# Patient Record
Sex: Female | Born: 1980
Health system: Southern US, Community
[De-identification: ages and names within clinical notes are randomized; demographics above are authoritative.]

## PROBLEM LIST (undated history)

## (undated) DIAGNOSIS — I10 Essential (primary) hypertension: Secondary | ICD-10-CM

## (undated) DIAGNOSIS — E039 Hypothyroidism, unspecified: Secondary | ICD-10-CM

## (undated) DIAGNOSIS — E079 Disorder of thyroid, unspecified: Secondary | ICD-10-CM

## (undated) DIAGNOSIS — T4145XA Adverse effect of unspecified anesthetic, initial encounter: Secondary | ICD-10-CM

## (undated) DIAGNOSIS — T8859XA Other complications of anesthesia, initial encounter: Secondary | ICD-10-CM

## (undated) HISTORY — PX: DILATION AND CURETTAGE OF UTERUS: SHX78

## (undated) HISTORY — DX: Disorder of thyroid, unspecified: E07.9

## (undated) HISTORY — DX: Hypothyroidism, unspecified: E03.9

## (undated) HISTORY — DX: Other complications of anesthesia, initial encounter: T88.59XA

## (undated) HISTORY — DX: Essential (primary) hypertension: I10

---
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
## 2006-03-14 HISTORY — PX: THYROIDECTOMY: SHX17

## 2006-03-14 HISTORY — PX: WISDOM TOOTH EXTRACTION: SHX21

## 2018-11-22 ENCOUNTER — Encounter (INDEPENDENT_AMBULATORY_CARE_PROVIDER_SITE_OTHER): Payer: Self-pay

## 2018-12-13 ENCOUNTER — Telehealth: Payer: Self-pay

## 2018-12-17 ENCOUNTER — Telehealth: Payer: Self-pay | Admitting: Family Medicine

## 2018-12-17 NOTE — Telephone Encounter (Signed)
Called the patient to confirm the upcoming appointment. Left a voicemail informing the patient the appointment will be completed over the phone. The visit is not a face to face visit. If you have any questions or concerns, please give our office a call at [phone].

## 2018-12-18 ENCOUNTER — Other Ambulatory Visit: Payer: Self-pay

## 2018-12-18 ENCOUNTER — Encounter: Payer: Self-pay | Admitting: Obstetrics & Gynecology

## 2018-12-18 ENCOUNTER — Telehealth: Payer: Self-pay | Admitting: Obstetrics & Gynecology

## 2018-12-18 ENCOUNTER — Ambulatory Visit (INDEPENDENT_AMBULATORY_CARE_PROVIDER_SITE_OTHER): Payer: Self-pay | Admitting: *Deleted

## 2018-12-18 DIAGNOSIS — O0990 Supervision of high risk pregnancy, unspecified, unspecified trimester: Secondary | ICD-10-CM | POA: Insufficient documentation

## 2018-12-18 NOTE — Telephone Encounter (Signed)
Attempted to call patient to get her rescheduled for her missed ob intake appointment. No answer, left voicemail instructing patient that her appointment on 10/29 has been canceled due to her not keeping her intake appointment. Patient instructed to give the office a call back to be rescheduled. No show letter mailed.

## 2018-12-18 NOTE — Progress Notes (Signed)
[id number] I called Susan Huff for her New OB Intake telephone visit and left a message I was calling for her telephone visit and will call again in a few minutes. Also noted no records found from [facility]- registrar calling for records.  Abram Sax,RN [phone] I called Susan Huff for her New OB Intake telephone visit and left a message I was calling for her telephone visit and since I did not reach her; she will need to be rescheduled. Please call our office to reschedule. Will leave note for registrars to reschedule. Jasiah Buntin,RN

## 2019-01-10 ENCOUNTER — Encounter: Payer: Self-pay | Admitting: Obstetrics & Gynecology

## 2019-01-23 ENCOUNTER — Ambulatory Visit (INDEPENDENT_AMBULATORY_CARE_PROVIDER_SITE_OTHER): Payer: Self-pay | Admitting: *Deleted

## 2019-01-23 ENCOUNTER — Other Ambulatory Visit: Payer: Self-pay

## 2019-01-23 VITALS — Ht 62.0 in

## 2019-01-23 DIAGNOSIS — O09899 Supervision of other high risk pregnancies, unspecified trimester: Secondary | ICD-10-CM

## 2019-01-23 DIAGNOSIS — O10919 Unspecified pre-existing hypertension complicating pregnancy, unspecified trimester: Secondary | ICD-10-CM | POA: Insufficient documentation

## 2019-01-23 DIAGNOSIS — O0990 Supervision of high risk pregnancy, unspecified, unspecified trimester: Secondary | ICD-10-CM

## 2019-01-23 DIAGNOSIS — O169 Unspecified maternal hypertension, unspecified trimester: Secondary | ICD-10-CM

## 2019-01-23 DIAGNOSIS — O10019 Pre-existing essential hypertension complicating pregnancy, unspecified trimester: Secondary | ICD-10-CM

## 2019-01-23 DIAGNOSIS — Z8279 Family history of other congenital malformations, deformations and chromosomal abnormalities: Secondary | ICD-10-CM

## 2019-01-23 DIAGNOSIS — E079 Disorder of thyroid, unspecified: Secondary | ICD-10-CM | POA: Insufficient documentation

## 2019-01-23 MED ORDER — BLOOD PRESSURE KIT DEVI: 1.0000 | 0 refills | Status: AC | PRN

## 2019-01-23 NOTE — Progress Notes (Signed)
I connected with  Susan Huff on 01/23/19 at  8:30 AM EST by telephone and verified that I am speaking with the correct person using two identifiers.   I discussed the limitations, risks, security and privacy concerns of performing an evaluation and management service by telephone and the availability of in person appointments. I also discussed with the patient that there may be a patient responsible charge related to this service. The patient expressed understanding and agreed to proceed. Explained I am completing her New OB Intake today.per chart stated transfer from [facility] but clarified with patient only had pregnancy test there.  We discussed Her EDD and that it is based on  sure LMP of 10/09/18  And that was the first day of LMP . I reviewed her allergies, meds, OB History, Medical /Surgical history, and appropriate screenings.she is Toprol XL and I reviewed with Dr. Harolyn Rutherford and she may continue taking .  I explained I will send her the [employer] app- app sent to her while on phone.  I explained we will send a blood pressure cuff to [facility] that will fill that prescription and they  will call her to verify her information. I asked her to bring the blood pressure cuff with her to her first ob appointment so we can show her how to use it. Explained  then we will have her take her blood pressure weekly and enter into the app. Explained she will have some visits in office and some virtually.  I sent her the MyChart text and she was having issues with creating account - states she has tried before . I gave her the MyChart help desk number and she will contact them and then  download  app. Reviewed appointment date/ time with her , our location and to wear mask, no visitors. Explained she will have exam, ob bloodwork, hemoglobin a1C, cbg , genetic testing if desired, pap if needed. I scheduled an [place] at 19 weeks and gave her the appointment.I also offered her genetic counseling which she accepted.  She  voices understanding.   Linda,RN 01/23/2019  8:33 AM

## 2019-01-23 NOTE — Progress Notes (Signed)
Patient seen and assessed by nursing staff.  Agree with documentation and plan.  

## 2019-01-23 NOTE — Patient Instructions (Signed)

## 2019-02-06 ENCOUNTER — Encounter: Payer: Self-pay | Admitting: Family Medicine

## 2019-02-14 ENCOUNTER — Encounter (HOSPITAL_COMMUNITY): Payer: Self-pay | Admitting: Genetic Counselor

## 2019-02-15 ENCOUNTER — Ambulatory Visit (HOSPITAL_COMMUNITY): Payer: Self-pay

## 2019-02-15 ENCOUNTER — Other Ambulatory Visit (HOSPITAL_COMMUNITY): Payer: Self-pay | Admitting: *Deleted

## 2019-02-20 ENCOUNTER — Other Ambulatory Visit: Payer: Self-pay

## 2019-02-20 ENCOUNTER — Encounter: Payer: Self-pay | Admitting: Obstetrics and Gynecology

## 2019-02-20 ENCOUNTER — Ambulatory Visit (INDEPENDENT_AMBULATORY_CARE_PROVIDER_SITE_OTHER): Payer: Self-pay | Admitting: Obstetrics and Gynecology

## 2019-02-20 VITALS — BP 121/82 | HR 84 | Wt 189.0 lb

## 2019-02-20 DIAGNOSIS — Z124 Encounter for screening for malignant neoplasm of cervix: Secondary | ICD-10-CM

## 2019-02-20 DIAGNOSIS — Z1151 Encounter for screening for human papillomavirus (HPV): Secondary | ICD-10-CM

## 2019-02-20 DIAGNOSIS — O09529 Supervision of elderly multigravida, unspecified trimester: Secondary | ICD-10-CM | POA: Insufficient documentation

## 2019-02-20 DIAGNOSIS — O09212 Supervision of pregnancy with history of pre-term labor, second trimester: Secondary | ICD-10-CM

## 2019-02-20 DIAGNOSIS — O0990 Supervision of high risk pregnancy, unspecified, unspecified trimester: Secondary | ICD-10-CM

## 2019-02-20 DIAGNOSIS — O99282 Endocrine, nutritional and metabolic diseases complicating pregnancy, second trimester: Secondary | ICD-10-CM

## 2019-02-20 DIAGNOSIS — O10012 Pre-existing essential hypertension complicating pregnancy, second trimester: Secondary | ICD-10-CM

## 2019-02-20 DIAGNOSIS — O09522 Supervision of elderly multigravida, second trimester: Secondary | ICD-10-CM

## 2019-02-20 DIAGNOSIS — O09299 Supervision of pregnancy with other poor reproductive or obstetric history, unspecified trimester: Secondary | ICD-10-CM | POA: Insufficient documentation

## 2019-02-20 DIAGNOSIS — Z113 Encounter for screening for infections with a predominantly sexual mode of transmission: Secondary | ICD-10-CM

## 2019-02-20 DIAGNOSIS — O09899 Supervision of other high risk pregnancies, unspecified trimester: Secondary | ICD-10-CM

## 2019-02-20 DIAGNOSIS — O99210 Obesity complicating pregnancy, unspecified trimester: Secondary | ICD-10-CM | POA: Insufficient documentation

## 2019-02-20 DIAGNOSIS — O09292 Supervision of pregnancy with other poor reproductive or obstetric history, second trimester: Secondary | ICD-10-CM

## 2019-02-20 DIAGNOSIS — O0992 Supervision of high risk pregnancy, unspecified, second trimester: Secondary | ICD-10-CM

## 2019-02-20 DIAGNOSIS — N898 Other specified noninflammatory disorders of vagina: Secondary | ICD-10-CM

## 2019-02-20 DIAGNOSIS — E079 Disorder of thyroid, unspecified: Secondary | ICD-10-CM

## 2019-02-20 DIAGNOSIS — E669 Obesity, unspecified: Secondary | ICD-10-CM | POA: Insufficient documentation

## 2019-02-20 DIAGNOSIS — O10019 Pre-existing essential hypertension complicating pregnancy, unspecified trimester: Secondary | ICD-10-CM

## 2019-02-20 DIAGNOSIS — Z3A19 19 weeks gestation of pregnancy: Secondary | ICD-10-CM

## 2019-02-20 MED ORDER — LEVOTHYROXINE SODIUM 200 MCG PO TABS: 200.0000 ug | ORAL_TABLET | Freq: Every day | ORAL | 1 refills | Status: DC

## 2019-02-20 MED ORDER — METOPROLOL SUCCINATE ER 100 MG PO TB24: 100.0000 mg | ORAL_TABLET | Freq: Every day | ORAL | 1 refills | Status: DC

## 2019-02-20 NOTE — Progress Notes (Signed)
New OB Note  02/20/2019   Clinic: [facility]  Chief Complaint: NOB  Transfer of Care Patient: no  Primary Care: [facility] ([place] ; Dr. Erling Conte)  History of Present Illness: Susan Huff is a 38 y.o. [id number] @ 19/2 weeks (EDC 5/4 [tentative], based on bedside u/s today= Patient's last menstrual period was 10/09/2018.).  Preg complicated by has Supervision of high risk pregnancy, antepartum; Thyroid disease; Hypertension in pregnancy, pre-existing, antepartum; History of preterm delivery, currently pregnant; AMA (advanced maternal age) multigravida 35+; and History of pre-eclampsia in prior pregnancy, currently pregnant on their problem list.   Any events prior to today's visit: no Her periods were: pretty regular, qmonth She was using no method when she conceived.  She has Negative signs or symptoms of nausea/vomiting of pregnancy. She has Negative signs or symptoms of miscarriage or preterm labor On any medications around the time she conceived/early pregnancy: No   ROS: A 12-point review of systems was performed and negative, except as stated in the above HPI.  OBGYN History: As per HPI. OB History  Gravida Para Term Preterm AB Living  6 4 2 2 1 4   SAB TAB Ectopic Multiple Live Births          4    # Outcome Date GA Lbr Len/2nd Weight Sex Delivery Anes PTL Lv  6 Current           5 Term 12/29/14 [redacted]w[redacted]d  6 lb 8 oz (2.948 kg)  Vag-Spont EPI  LIV     Birth Comments: On toprol , and on synthroid;   4 Term 11/05/06 [redacted]w[redacted]d  6 lb 14 oz (3.118 kg)  Vag-Spont EPI  LIV     Birth Comments: Thyroid issues, at 6 months had thyroid storm, and had thyroidectomy, was on Toprol XL  3 Preterm 12/19/05 [redacted]w[redacted]d  3 lb 14 oz (1.758 kg) F    LIV     Birth Comments: hydrocephalus, PROM, PTD , had thyroid issues- levels were " out of wack" meds made her sick ; was on Toprol XL  2 Preterm 01/28/05 [redacted]w[redacted]d  2 lb 8 oz (1.134 kg)  Vag-Spont EPI  LIV     Birth  Comments: IOL for preeeclampsia, high blood pressure diagnosed during pregnancy- started toprol   1 AB 2005 [redacted]w[redacted]d            Birth Comments: had D&C  , no FHR at prenatal appt    Any issues with any prior pregnancies: Yes, see above. Patient states after thyroidectomy with G4 pregnancy her subsequent pregnancies have been fine and w/o issue Prior children are healthy, doing well, and without any problems or issues: yes History of pap smears: Yes. Last pap smear with PCP a few years ago, negative  Past Medical History: Past Medical History:  Diagnosis Date  . Complication of anesthesia    in dental office; had to be resuscitate and sent to  hospital  . Hypertension   . Preterm labor   . Thyroid disease     Past Surgical History: Past Surgical History:  Procedure Laterality Date  . DILATION AND CURETTAGE OF UTERUS    . THYROIDECTOMY  2008  . WISDOM TOOTH EXTRACTION  2008    Family History:  Family History  Adopted: Yes     Social History:  Social History   Socioeconomic History  . Marital status: Single    Spouse name: Not on file  . Number of children: Not on file  .  Years of education: Not on file  . Highest education level: Not on file  Occupational History  . Not on file  Social Needs  . Financial resource strain: Not on file  . Food insecurity    Worry: Never true    Inability: Never true  . Transportation needs    Medical: No    Non-medical: No  Tobacco Use  . Smoking status: Former Smoker    Types: Cigarettes    Quit date: 01/22/2017    Years since quitting: 2.0  . Smokeless tobacco: Never Used  Substance and Sexual Activity  . Alcohol use: Never    Frequency: Never  . Drug use: Never  . Sexual activity: Yes    Birth control/protection: None  Lifestyle  . Physical activity    Days per week: Not on file    Minutes per session: Not on file  . Stress: Not on file  Relationships  . Social Herbalist on phone: Not on file    Gets  together: Not on file    Attends religious service: Not on file    Active member of club or organization: Not on file    Attends meetings of clubs or organizations: Not on file    Relationship status: Not on file  . Intimate partner violence    Fear of current or ex partner: Not on file    Emotionally abused: Not on file    Physically abused: Not on file    Forced sexual activity: Not on file  Other Topics Concern  . Not on file  Social History Narrative  . Not on file    Allergy: Allergies  Allergen Reactions  . Sulfa Antibiotics Rash   Current Outpatient Medications: PNV Synthroid 200 qday (same as pre-pregnancy dose) Toprol XL 100 qday  Physical Exam:   BP 121/82   Pulse 84   Wt 189 lb (85.7 kg)   LMP 10/09/2018   BMI 34.57 kg/m  Body mass index is 34.57 kg/m. Contractions: Not present Vag. Bleeding: None. Fundal height: not applicable FHTs: 761P  General appearance: Well nourished, well developed female in no acute distress.  Neck:  Supple, normal appearance, and no thyromegaly  Cardiovascular: S1, S2 normal, no murmur, rub or gallop, regular rate and rhythm Respiratory:  Clear to auscultation bilateral. Normal respiratory effort Abdomen: positive bowel sounds and no masses, hernias; diffusely non tender to palpation, non distended Breasts: breasts appear normal, no suspicious masses, no skin or nipple changes or axillary nodes, and normal palpation. Neuro/Psych:  Normal mood and affect.  Skin:  Warm and dry.  Lymphatic:  No inguinal lymphadenopathy.   Pelvic exam: is not limited by body habitus EGBUS: within normal limits, Vagina: within normal limits and with no blood in the vault, Cervix: normal appearing cervix without discharge or lesions, closed/long/high, Uterus:  enlarged, c/w 18 week size, and Adnexa:  normal adnexa and no mass, fullness, tenderness  Laboratory: none  Imaging:  Bedside u/s: SLIUP, subj normal AF, +FM, FHR 160s, BPD/HC/FL c/w  18/0  Assessment: pt doing well  Plan: 1. Supervision of high risk pregnancy, antepartum Routine care. Amenable to starting low dose asa - Obstetric Panel, Including HIV - Hemoglobin A1c - Culture, OB Urine - Genetic Screening - AFP, Serum, Open Spina Bifida - CMP - TSH - T4, free - Protein / creatinine ratio, urine - Cytology - PAP( [place])  2. Multigravida of advanced maternal age in second trimester Genetics today F/u anatomy u/s  3. History of pre-eclampsia in prior pregnancy, currently pregnant  4. Thyroid disease Followed by PCP prior to this. Follow up labs  5. Pre-existing essential hypertension during pregnancy, antepartum Serial growth u/s, routine ap testing  6. History of preterm delivery, currently pregnant D/w her that it sounds like it was related to thyroid issue since no issues after her thyroid surgery and has never been on 17p. D/w her that she is technically able to get 17p. Pt declines which I think is very reasonable.   Problem list reviewed and updated.  Follow up in 3 weeks. Same day as anatomy u/s  The nature of [facility] with multiple MDs and other Advanced Practice Providers was explained to patient; also emphasized that residents, students are part of our team.  >50% of 25 min visit spent on counseling and coordination of care.     Cornelia Copaharlie Samiyyah Moffa, Jr. MD Attending [facility](Faculty Practice)

## 2019-02-20 NOTE — Progress Notes (Signed)
Need refill on synthroid & Toprol-xl

## 2019-02-21 LAB — COMPREHENSIVE METABOLIC PANEL
ALT: 24 IU/L (ref 0–32)
AST: 24 IU/L (ref 0–40)
Albumin/Globulin Ratio: 1.4 (ref 1.2–2.2)
Albumin: 4.1 g/dL (ref 3.8–4.8)
Alkaline Phosphatase: 62 IU/L (ref 39–117)
BUN/Creatinine Ratio: 7 — ABNORMAL LOW (ref 9–23)
BUN: 4 mg/dL — ABNORMAL LOW (ref 6–20)
Bilirubin Total: 0.4 mg/dL (ref 0.0–1.2)
CO2: 18 mmol/L — ABNORMAL LOW (ref 20–29)
Calcium: 9.3 mg/dL (ref 8.7–10.2)
Chloride: 101 mmol/L (ref 96–106)
Creatinine, Ser: 0.6 mg/dL (ref 0.57–1.00)
GFR calc Af Amer: 135 mL/min/{1.73_m2} (ref >59)
GFR calc non Af Amer: 117 mL/min/{1.73_m2} (ref >59)
Globulin, Total: 2.9 g/dL (ref 1.5–4.5)
Glucose: 69 mg/dL (ref 65–99)
Potassium: 3.7 mmol/L (ref 3.5–5.2)
Sodium: 136 mmol/L (ref 134–144)
Total Protein: 7 g/dL (ref 6.0–8.5)

## 2019-02-21 LAB — PROTEIN / CREATININE RATIO, URINE
Creatinine, Urine: 344.8 mg/dL
Protein, Ur: 80.6 mg/dL
Protein/Creat Ratio: 234 mg/g creat — ABNORMAL HIGH (ref 0–200)

## 2019-02-21 LAB — TSH: TSH: 74.7 u[IU]/mL — ABNORMAL HIGH (ref 0.450–4.500)

## 2019-02-21 LAB — T4, FREE: Free T4: 0.25 ng/dL — ABNORMAL LOW (ref 0.82–1.77)

## 2019-02-22 LAB — AFP, SERUM, OPEN SPINA BIFIDA
AFP MoM: 1.17
AFP Value: 56 ng/mL
Gest. Age on Collection Date: 19.1 weeks
Maternal Age At EDD: 37.9 yr
OSBR Risk 1 IN: 10000
Test Results:: NEGATIVE
Weight: 189 [lb_av]

## 2019-02-22 LAB — OBSTETRIC PANEL, INCLUDING HIV
Antibody Screen: NEGATIVE
Basophils Absolute: 0 10*3/uL (ref 0.0–0.2)
Basos: 0 %
EOS (ABSOLUTE): 0.1 10*3/uL (ref 0.0–0.4)
Eos: 1 %
HIV Screen 4th Generation wRfx: NONREACTIVE
Hematocrit: 39.1 % (ref 34.0–46.6)
Hemoglobin: 12.8 g/dL (ref 11.1–15.9)
Hepatitis B Surface Ag: NEGATIVE
Immature Grans (Abs): 0 10*3/uL (ref 0.0–0.1)
Immature Granulocytes: 0 %
Lymphocytes Absolute: 1.8 10*3/uL (ref 0.7–3.1)
Lymphs: 26 %
MCH: 29.6 pg (ref 26.6–33.0)
MCHC: 32.7 g/dL (ref 31.5–35.7)
MCV: 91 fL (ref 79–97)
Monocytes Absolute: 0.4 10*3/uL (ref 0.1–0.9)
Monocytes: 5 %
Neutrophils Absolute: 4.6 10*3/uL (ref 1.4–7.0)
Neutrophils: 68 %
Platelets: 368 10*3/uL (ref 150–450)
RBC: 4.32 x10E6/uL (ref 3.77–5.28)
RDW: 14.9 % (ref 11.7–15.4)
RPR Ser Ql: NONREACTIVE
Rh Factor: POSITIVE
Rubella Antibodies, IGG: 5.78 index (ref >0.99)
WBC: 6.9 10*3/uL (ref 3.4–10.8)

## 2019-02-22 LAB — HEMOGLOBIN A1C
Est. average glucose Bld gHb Est-mCnc: 105 mg/dL
Hgb A1c MFr Bld: 5.3 % (ref 4.8–5.6)

## 2019-02-22 LAB — URINE CULTURE, OB REFLEX

## 2019-02-22 LAB — CULTURE, OB URINE

## 2019-02-25 ENCOUNTER — Other Ambulatory Visit: Payer: Self-pay | Admitting: Obstetrics and Gynecology

## 2019-02-25 DIAGNOSIS — E079 Disorder of thyroid, unspecified: Secondary | ICD-10-CM

## 2019-02-25 DIAGNOSIS — O0990 Supervision of high risk pregnancy, unspecified, unspecified trimester: Secondary | ICD-10-CM

## 2019-02-25 MED ORDER — LEVOTHYROXINE SODIUM 50 MCG PO TABS: 50.0000 ug | ORAL_TABLET | Freq: Every day | ORAL | 0 refills | Status: DC

## 2019-02-25 MED ORDER — LEVOTHYROXINE SODIUM 200 MCG PO TABS: 200.0000 ug | ORAL_TABLET | Freq: Every day | ORAL | 0 refills | Status: DC

## 2019-02-28 ENCOUNTER — Encounter: Payer: Self-pay | Admitting: General Practice

## 2019-03-04 LAB — CYTOLOGY - PAP
Chlamydia: NEGATIVE
Comment: NEGATIVE
Comment: NEGATIVE
Comment: NEGATIVE
Comment: NEGATIVE
Comment: NEGATIVE
Comment: NORMAL
HPV 16: NEGATIVE
HPV 18 / 45: NEGATIVE
High risk HPV: POSITIVE — AB
Trichomonas: NEGATIVE

## 2019-03-05 ENCOUNTER — Encounter: Payer: Self-pay | Admitting: Obstetrics and Gynecology

## 2019-03-05 DIAGNOSIS — R87612 Low grade squamous intraepithelial lesion on cytologic smear of cervix (LGSIL): Secondary | ICD-10-CM | POA: Insufficient documentation

## 2019-03-13 ENCOUNTER — Other Ambulatory Visit (HOSPITAL_COMMUNITY): Payer: Self-pay | Admitting: *Deleted

## 2019-03-13 ENCOUNTER — Encounter: Payer: Self-pay | Admitting: Obstetrics and Gynecology

## 2019-03-13 ENCOUNTER — Ambulatory Visit (HOSPITAL_COMMUNITY): Payer: Self-pay | Admitting: *Deleted

## 2019-03-13 ENCOUNTER — Encounter (HOSPITAL_COMMUNITY): Payer: Self-pay

## 2019-03-13 ENCOUNTER — Other Ambulatory Visit: Payer: Self-pay

## 2019-03-13 ENCOUNTER — Ambulatory Visit (HOSPITAL_COMMUNITY)
Admission: RE | Admit: 2019-03-13 | Discharge: 2019-03-13 | Disposition: A | Discharge status: Home / Self Care | Payer: Self-pay | Source: Ambulatory Visit | Attending: Family Medicine | Admitting: Family Medicine

## 2019-03-13 DIAGNOSIS — Z8279 Family history of other congenital malformations, deformations and chromosomal abnormalities: Secondary | ICD-10-CM | POA: Insufficient documentation

## 2019-03-13 DIAGNOSIS — O0990 Supervision of high risk pregnancy, unspecified, unspecified trimester: Secondary | ICD-10-CM | POA: Insufficient documentation

## 2019-03-13 DIAGNOSIS — O09292 Supervision of pregnancy with other poor reproductive or obstetric history, second trimester: Secondary | ICD-10-CM

## 2019-03-13 DIAGNOSIS — O10019 Pre-existing essential hypertension complicating pregnancy, unspecified trimester: Secondary | ICD-10-CM | POA: Insufficient documentation

## 2019-03-13 DIAGNOSIS — O10912 Unspecified pre-existing hypertension complicating pregnancy, second trimester: Secondary | ICD-10-CM

## 2019-03-13 DIAGNOSIS — O09899 Supervision of other high risk pregnancies, unspecified trimester: Secondary | ICD-10-CM | POA: Insufficient documentation

## 2019-03-13 DIAGNOSIS — E079 Disorder of thyroid, unspecified: Secondary | ICD-10-CM | POA: Insufficient documentation

## 2019-03-13 DIAGNOSIS — O09522 Supervision of elderly multigravida, second trimester: Secondary | ICD-10-CM

## 2019-03-13 DIAGNOSIS — O99282 Endocrine, nutritional and metabolic diseases complicating pregnancy, second trimester: Secondary | ICD-10-CM

## 2019-03-13 DIAGNOSIS — O10012 Pre-existing essential hypertension complicating pregnancy, second trimester: Secondary | ICD-10-CM

## 2019-03-13 DIAGNOSIS — Z3A20 20 weeks gestation of pregnancy: Secondary | ICD-10-CM

## 2019-03-13 DIAGNOSIS — O09212 Supervision of pregnancy with history of pre-term labor, second trimester: Secondary | ICD-10-CM

## 2019-03-13 NOTE — Progress Notes (Signed)
Patient did not keep her OB appointment for 03/13/2019.  Susan Huff, Jr MD Attending [facility] ([facility] Practice)   

## 2019-03-18 ENCOUNTER — Ambulatory Visit (HOSPITAL_COMMUNITY): Payer: Self-pay

## 2019-03-18 ENCOUNTER — Encounter (HOSPITAL_COMMUNITY): Payer: Self-pay

## 2019-03-18 ENCOUNTER — Other Ambulatory Visit (HOSPITAL_COMMUNITY): Payer: Self-pay

## 2019-03-25 ENCOUNTER — Ambulatory Visit (INDEPENDENT_AMBULATORY_CARE_PROVIDER_SITE_OTHER): Payer: Self-pay | Admitting: Student

## 2019-03-25 VITALS — BP 121/83 | HR 77 | Wt 198.0 lb

## 2019-03-25 DIAGNOSIS — O0990 Supervision of high risk pregnancy, unspecified, unspecified trimester: Secondary | ICD-10-CM

## 2019-03-25 DIAGNOSIS — Z3A21 21 weeks gestation of pregnancy: Secondary | ICD-10-CM

## 2019-03-25 DIAGNOSIS — E079 Disorder of thyroid, unspecified: Secondary | ICD-10-CM

## 2019-03-25 NOTE — Progress Notes (Signed)
   PRENATAL VISIT NOTE  Subjective:  Susan Huff is a 39 y.o. [phone] at [redacted]w[redacted]d being seen today for ongoing prenatal care.  She is currently monitored for the following issues for this high-risk pregnancy and has Supervision of high risk pregnancy, antepartum; Thyroid disease; Hypertension in pregnancy, pre-existing, antepartum; History of preterm delivery, currently pregnant; AMA (advanced maternal age) multigravida 35+; History of pre-eclampsia in prior pregnancy, currently pregnant; Obesity in pregnancy; Obesity, Class II, BMI 35-39.9; and LGSIL on Pap smear of cervix on their problem list.  Patient reports no complaints.  Contractions: Not present. Vag. Bleeding: None.  Movement: Present. Denies leaking of fluid.   Patient reports episode of low blood pressure on Friday. States she felt foggy and weak all day. Symptoms have resolved and BPs back to normal.   The following portions of the patient's history were reviewed and updated as appropriate: allergies, current medications, past family history, past medical history, past social history, past surgical history and problem list.   Objective:   Vitals:   03/25/19 0939  BP: 121/83  Pulse: 77  Weight: 198 lb (89.8 kg)    Fetal Status: Fetal Heart Rate (bpm): 152   Movement: Present     General:  Alert, oriented and cooperative. Patient is in no acute distress.  Skin: Skin is warm and dry. No rash noted.   Cardiovascular: Normal heart rate noted  Respiratory: Normal respiratory effort, no problems with respiration noted  Abdomen: Soft, gravid, appropriate for gestational age.  Pain/Pressure: Absent     Pelvic: Cervical exam deferred        Extremities: Normal range of motion.  Edema: None  Mental Status: Normal mood and affect. Normal behavior. Normal judgment and thought content.   Assessment and Plan:  Pregnancy: [id number] at [redacted]w[redacted]d 1. Thyroid disease -synthroid increased after last visit & pt reports taking as prescribed. Labs  checked today.  - TSH - T4, free  2. Supervision of high risk pregnancy, antepartum -EDD updated per MFM  -BP normal today and back to baseline per patient. Will not make antihypertensive adjustment at this time but patient will reach out to [place] if she continues to have hypotensive episodes.   Preterm labor symptoms and general obstetric precautions including but not limited to vaginal bleeding, contractions, leaking of fluid and fetal movement were reviewed in detail with the patient. Please refer to After Visit Summary for other counseling recommendations.   Return in about 4 weeks (around 04/22/2019) for high risk ob with MD.  Future Appointments  Date Time Provider Department Center  04/10/2019  7:45 AM WH-MFC [place] 2 WH-MFCUS MFC-US  04/10/2019  7:55 AM WH-MFC NURSE WH-MFC MFC-US  04/22/2019  8:15 AM Reva Bores, MD [facility]    Judeth Horn, NP

## 2019-03-25 NOTE — Progress Notes (Signed)
Experiencing Low blood pressure 88/56 and was feeling bad on friday morning. Has flu and Tdap @ work @ [employer]

## 2019-03-25 NOTE — Patient Instructions (Addendum)
The Maternity Assessment Unit (MAU) is located at the [facility] and [place] at [facility]. The address is: [address] [place], [place], [place] [address]. Please see map below for additional directions.    The Maternity Assessment Unit is designed to help you during your pregnancy, and for up to 6 weeks after delivery, with any pregnancy- or postpartum-related emergencies, if you think you are in labor, or if your water has broken. For example, if you experience nausea and vomiting, vaginal bleeding, severe abdominal or pelvic pain, elevated blood pressure or other problems related to your pregnancy or postpartum time, please come to the Maternity Assessment Unit for assistance.      Second Trimester of Pregnancy The second trimester is from week 14 through week 27 (months 4 through 6). The second trimester is often a time when you feel your best. Your body has adjusted to being pregnant, and you begin to feel better physically. Usually, morning sickness has lessened or quit completely, you may have more energy, and you may have an increase in appetite. The second trimester is also a time when the fetus is growing rapidly. At the end of the sixth month, the fetus is about 9 inches long and weighs about 1 pounds. You will likely begin to feel the baby move (quickening) between 16 and 20 weeks of pregnancy. Body changes during your second trimester Your body continues to go through many changes during your second trimester. The changes vary from woman to woman.  Your weight will continue to increase. You will notice your lower abdomen bulging out.  You may begin to get stretch marks on your hips, abdomen, and breasts.  You may develop headaches that can be relieved by medicines. The medicines should be approved by your health care provider.  You may urinate more often because the fetus is pressing on your bladder.  You may develop or continue to have heartburn as  a result of your pregnancy.  You may develop constipation because certain hormones are causing the muscles that push waste through your intestines to slow down.  You may develop hemorrhoids or swollen, bulging veins (varicose veins).  You may have back pain. This is caused by: ? Weight gain. ? Pregnancy hormones that are relaxing the joints in your pelvis. ? A shift in weight and the muscles that support your balance.  Your breasts will continue to grow and they will continue to become tender.  Your gums may bleed and may be sensitive to brushing and flossing.  Dark spots or blotches (chloasma, mask of pregnancy) may develop on your face. This will likely fade after the baby is born.  A dark line from your belly button to the pubic area (linea nigra) may appear. This will likely fade after the baby is born.  You may have changes in your hair. These can include thickening of your hair, rapid growth, and changes in texture. Some women also have hair loss during or after pregnancy, or hair that feels dry or thin. Your hair will most likely return to normal after your baby is born. What to expect at prenatal visits During a routine prenatal visit:  You will be weighed to make sure you and the fetus are growing normally.  Your blood pressure will be taken.  Your abdomen will be measured to track your baby's growth.  The fetal heartbeat will be listened to.  Any test results from the previous visit will be discussed. Your health care provider  may ask you:  How you are feeling.  If you are feeling the baby move.  If you have had any abnormal symptoms, such as leaking fluid, bleeding, severe headaches, or abdominal cramping.  If you are using any tobacco products, including cigarettes, chewing tobacco, and electronic cigarettes.  If you have any questions. Other tests that may be performed during your second trimester include:  Blood tests that check for: ? Low iron levels  (anemia). ? High blood sugar that affects pregnant women (gestational diabetes) between 24 and 28 weeks. ? Rh antibodies. This is to check for a protein on red blood cells (Rh factor).  Urine tests to check for infections, diabetes, or protein in the urine.  An ultrasound to confirm the proper growth and development of the baby.  An amniocentesis to check for possible genetic problems.  Fetal screens for spina bifida and Down syndrome.  HIV (human immunodeficiency virus) testing. Routine prenatal testing includes screening for HIV, unless you choose not to have this test. Follow these instructions at home: Medicines  Follow your health care provider's instructions regarding medicine use. Specific medicines may be either safe or unsafe to take during pregnancy.  Take a prenatal vitamin that contains at least 600 micrograms (mcg) of folic acid.  If you develop constipation, try taking a stool softener if your health care provider approves. Eating and drinking   Eat a balanced diet that includes fresh fruits and vegetables, whole grains, good sources of protein such as meat, eggs, or tofu, and low-fat dairy. Your health care provider will help you determine the amount of weight gain that is right for you.  Avoid raw meat and uncooked cheese. These carry germs that can cause birth defects in the baby.  If you have low calcium intake from food, talk to your health care provider about whether you should take a daily calcium supplement.  Limit foods that are high in fat and processed sugars, such as fried and sweet foods.  To prevent constipation: ? Drink enough fluid to keep your urine clear or pale yellow. ? Eat foods that are high in fiber, such as fresh fruits and vegetables, whole grains, and beans. Activity  Exercise only as directed by your health care provider. Most women can continue their usual exercise routine during pregnancy. Try to exercise for 30 minutes at least 5 days a  week. Stop exercising if you experience uterine contractions.  Avoid heavy lifting, wear low heel shoes, and practice good posture.  A sexual relationship may be continued unless your health care provider directs you otherwise. Relieving pain and discomfort  Wear a good support bra to prevent discomfort from breast tenderness.  Take warm sitz baths to soothe any pain or discomfort caused by hemorrhoids. Use hemorrhoid cream if your health care provider approves.  Rest with your legs elevated if you have leg cramps or low back pain.  If you develop varicose veins, wear support hose. Elevate your feet for 15 minutes, 3-4 times a day. Limit salt in your diet. Prenatal Care  Write down your questions. Take them to your prenatal visits.  Keep all your prenatal visits as told by your health care provider. This is important. Safety  Wear your seat belt at all times when driving.  Make a list of emergency phone numbers, including numbers for family, friends, the hospital, and police and fire departments. General instructions  Ask your health care provider for a referral to a local prenatal education class. Begin classes no  later than the beginning of month 6 of your pregnancy.  Ask for help if you have counseling or nutritional needs during pregnancy. Your health care provider can offer advice or refer you to specialists for help with various needs.  Do not use hot tubs, steam rooms, or saunas.  Do not douche or use tampons or scented sanitary pads.  Do not cross your legs for long periods of time.  Avoid cat litter boxes and soil used by cats. These carry germs that can cause birth defects in the baby and possibly loss of the fetus by miscarriage or stillbirth.  Avoid all smoking, herbs, alcohol, and unprescribed drugs. Chemicals in these products can affect the formation and growth of the baby.  Do not use any products that contain nicotine or tobacco, such as cigarettes and  e-cigarettes. If you need help quitting, ask your health care provider.  Visit your dentist if you have not gone yet during your pregnancy. Use a soft toothbrush to brush your teeth and be gentle when you floss. Contact a health care provider if:  You have dizziness.  You have mild pelvic cramps, pelvic pressure, or nagging pain in the abdominal area.  You have persistent nausea, vomiting, or diarrhea.  You have a bad smelling vaginal discharge.  You have pain when you urinate. Get help right away if:  You have a fever.  You are leaking fluid from your vagina.  You have spotting or bleeding from your vagina.  You have severe abdominal cramping or pain.  You have rapid weight gain or weight loss.  You have shortness of breath with chest pain.  You notice sudden or extreme swelling of your face, hands, ankles, feet, or legs.  You have not felt your baby move in over an hour.  You have severe headaches that do not go away when you take medicine.  You have vision changes. Summary  The second trimester is from week 14 through week 27 (months 4 through 6). It is also a time when the fetus is growing rapidly.  Your body goes through many changes during pregnancy. The changes vary from woman to woman.  Avoid all smoking, herbs, alcohol, and unprescribed drugs. These chemicals affect the formation and growth your baby.  Do not use any tobacco products, such as cigarettes, chewing tobacco, and e-cigarettes. If you need help quitting, ask your health care provider.  Contact your health care provider if you have any questions. Keep all prenatal visits as told by your health care provider. This is important. This information is not intended to replace advice given to you by your health care provider. Make sure you discuss any questions you have with your health care provider. Document Revised: 06/22/2018 Document Reviewed: 04/05/2016 Elsevier Patient Education  2020 [employer].

## 2019-03-26 LAB — TSH: TSH: 35.9 u[IU]/mL — ABNORMAL HIGH (ref 0.450–4.500)

## 2019-03-26 LAB — T4, FREE: Free T4: 0.54 ng/dL — ABNORMAL LOW (ref 0.82–1.77)

## 2019-03-26 MED ORDER — LEVOTHYROXINE SODIUM 50 MCG PO TABS: 100.0000 ug | ORAL_TABLET | Freq: Every day | ORAL | 0 refills | Status: DC

## 2019-03-26 NOTE — Addendum Note (Signed)
Addended by: Judeth Horn B on: 03/26/2019 03:26 PM   Modules accepted: Orders

## 2019-03-27 ENCOUNTER — Encounter: Payer: Self-pay | Admitting: *Deleted

## 2019-04-10 ENCOUNTER — Encounter (HOSPITAL_COMMUNITY): Payer: Self-pay

## 2019-04-10 ENCOUNTER — Ambulatory Visit (HOSPITAL_COMMUNITY): Payer: Self-pay

## 2019-04-10 ENCOUNTER — Ambulatory Visit (HOSPITAL_COMMUNITY): Payer: Self-pay | Attending: Obstetrics and Gynecology

## 2019-04-16 ENCOUNTER — Other Ambulatory Visit: Payer: Self-pay

## 2019-04-16 MED ORDER — LEVOTHYROXINE SODIUM 300 MCG PO TABS: 300.0000 ug | ORAL_TABLET | Freq: Every day | ORAL | 1 refills | Status: DC

## 2019-04-16 NOTE — Progress Notes (Signed)
Per Judeth Horn last note increase levothyroxine to 300mg  daily Pharmacy sent over request to new Rx Filled per note and per protocol.

## 2019-04-17 ENCOUNTER — Encounter: Payer: Self-pay | Admitting: Obstetrics and Gynecology

## 2019-04-17 DIAGNOSIS — D563 Thalassemia minor: Secondary | ICD-10-CM | POA: Insufficient documentation

## 2019-04-22 ENCOUNTER — Other Ambulatory Visit: Payer: Self-pay

## 2019-04-22 ENCOUNTER — Telehealth (INDEPENDENT_AMBULATORY_CARE_PROVIDER_SITE_OTHER): Payer: Self-pay | Admitting: Obstetrics & Gynecology

## 2019-04-22 VITALS — BP 126/79 | HR 87

## 2019-04-22 DIAGNOSIS — O0990 Supervision of high risk pregnancy, unspecified, unspecified trimester: Secondary | ICD-10-CM

## 2019-04-22 DIAGNOSIS — O09899 Supervision of other high risk pregnancies, unspecified trimester: Secondary | ICD-10-CM

## 2019-04-22 DIAGNOSIS — O10019 Pre-existing essential hypertension complicating pregnancy, unspecified trimester: Secondary | ICD-10-CM

## 2019-04-22 DIAGNOSIS — E079 Disorder of thyroid, unspecified: Secondary | ICD-10-CM

## 2019-04-22 DIAGNOSIS — O10012 Pre-existing essential hypertension complicating pregnancy, second trimester: Secondary | ICD-10-CM

## 2019-04-22 DIAGNOSIS — O09522 Supervision of elderly multigravida, second trimester: Secondary | ICD-10-CM

## 2019-04-22 DIAGNOSIS — Z3A25 25 weeks gestation of pregnancy: Secondary | ICD-10-CM

## 2019-04-22 NOTE — Progress Notes (Signed)
I connected with  Susan Huff on 04/22/19 at  8:15 AM EST by telephone and verified that I am speaking with the correct person using two identifiers.   I discussed the limitations, risks, security and privacy concerns of performing an evaluation and management service by telephone and the availability of in person appointments. I also discussed with the patient that there may be a patient responsible charge related to this service. The patient expressed understanding and agreed to proceed.  [name], CMA 04/22/2019  8:25 AM

## 2019-04-22 NOTE — Progress Notes (Signed)
    TELEHEALTH VIRTUAL OBSTETRICS VISIT ENCOUNTER NOTE  I connected with Susan Huff on 04/22/19 at  8:15 AM EST by telephone at home and verified that I am speaking with the correct person using two identifiers.   I discussed the limitations, risks, security and privacy concerns of performing an evaluation and management service by telephone and the availability of in person appointments. I also discussed with the patient that there may be a patient responsible charge related to this service. The patient expressed understanding and agreed to proceed.  Subjective:  Susan Huff is a 39 y.o. [phone] at [redacted]w[redacted]d being followed for ongoing prenatal care.  She is currently monitored for the following issues for this high-risk pregnancy and has Supervision of high risk pregnancy, antepartum; Thyroid disease; Hypertension in pregnancy, pre-existing, antepartum; History of preterm delivery, currently pregnant; AMA (advanced maternal age) multigravida 35+; History of pre-eclampsia in prior pregnancy, currently pregnant; Obesity in pregnancy; Obesity, Class II, BMI 35-39.9; LGSIL on Pap smear of cervix; and Alpha thalassemia silent carrier on their problem list.  Patient reports no complaints. Reports fetal movement. Denies any contractions, bleeding or leaking of fluid.   The following portions of the patient's history were reviewed and updated as appropriate: allergies, current medications, past family history, past medical history, past social history, past surgical history and problem list.   Objective:   General:  Alert, oriented and cooperative.   Mental Status: Normal mood and affect perceived. Normal judgment and thought content.  Rest of physical exam deferred due to type of encounter  Assessment and Plan:  Pregnancy: [id number] at [redacted]w[redacted]d 1. Supervision of high risk pregnancy, antepartum growth - [place] MFM OB FOLLOW UP; Future  2. History of preterm delivery, currently pregnant  3. Thyroid  disease TSH in 2 weeks  4. Pre-existing essential hypertension during pregnancy, antepartum BP control is acceptible  Preterm labor symptoms and general obstetric precautions including but not limited to vaginal bleeding, contractions, leaking of fluid and fetal movement were reviewed in detail with the patient.  I discussed the assessment and treatment plan with the patient. The patient was provided an opportunity to ask questions and all were answered. The patient agreed with the plan and demonstrated an understanding of the instructions. The patient was advised to call back or seek an in-person office evaluation/go to MAU at [facility] for any urgent or concerning symptoms. Please refer to After Visit Summary for other counseling recommendations.   I provided 12 minutes of non-face-to-face time during this encounter.  Return in about 2 weeks (around 05/06/2019) for TSH and 2 hr.  No future appointments.  Scheryl Darter, MD [facility], [facility]

## 2019-04-22 NOTE — Patient Instructions (Signed)

## 2019-05-08 ENCOUNTER — Other Ambulatory Visit: Payer: Self-pay | Admitting: *Deleted

## 2019-05-08 DIAGNOSIS — O10019 Pre-existing essential hypertension complicating pregnancy, unspecified trimester: Secondary | ICD-10-CM

## 2019-05-08 DIAGNOSIS — O09529 Supervision of elderly multigravida, unspecified trimester: Secondary | ICD-10-CM

## 2019-05-08 DIAGNOSIS — O0990 Supervision of high risk pregnancy, unspecified, unspecified trimester: Secondary | ICD-10-CM

## 2019-05-08 DIAGNOSIS — O09899 Supervision of other high risk pregnancies, unspecified trimester: Secondary | ICD-10-CM

## 2019-05-10 ENCOUNTER — Other Ambulatory Visit: Payer: Self-pay

## 2019-05-10 ENCOUNTER — Ambulatory Visit (INDEPENDENT_AMBULATORY_CARE_PROVIDER_SITE_OTHER): Payer: Managed Care, Other (non HMO) | Admitting: Obstetrics & Gynecology

## 2019-05-10 ENCOUNTER — Encounter: Payer: Self-pay | Admitting: *Deleted

## 2019-05-10 ENCOUNTER — Other Ambulatory Visit: Payer: Managed Care, Other (non HMO)

## 2019-05-10 VITALS — BP 118/72 | HR 80 | Wt 189.0 lb

## 2019-05-10 DIAGNOSIS — O10019 Pre-existing essential hypertension complicating pregnancy, unspecified trimester: Secondary | ICD-10-CM

## 2019-05-10 DIAGNOSIS — O09523 Supervision of elderly multigravida, third trimester: Secondary | ICD-10-CM

## 2019-05-10 DIAGNOSIS — D563 Thalassemia minor: Secondary | ICD-10-CM

## 2019-05-10 DIAGNOSIS — O10013 Pre-existing essential hypertension complicating pregnancy, third trimester: Secondary | ICD-10-CM

## 2019-05-10 DIAGNOSIS — O0993 Supervision of high risk pregnancy, unspecified, third trimester: Secondary | ICD-10-CM

## 2019-05-10 DIAGNOSIS — O09899 Supervision of other high risk pregnancies, unspecified trimester: Secondary | ICD-10-CM

## 2019-05-10 DIAGNOSIS — O09293 Supervision of pregnancy with other poor reproductive or obstetric history, third trimester: Secondary | ICD-10-CM

## 2019-05-10 DIAGNOSIS — O09529 Supervision of elderly multigravida, unspecified trimester: Secondary | ICD-10-CM

## 2019-05-10 DIAGNOSIS — O09299 Supervision of pregnancy with other poor reproductive or obstetric history, unspecified trimester: Secondary | ICD-10-CM

## 2019-05-10 DIAGNOSIS — E079 Disorder of thyroid, unspecified: Secondary | ICD-10-CM

## 2019-05-10 DIAGNOSIS — Z3A28 28 weeks gestation of pregnancy: Secondary | ICD-10-CM

## 2019-05-10 DIAGNOSIS — O0990 Supervision of high risk pregnancy, unspecified, unspecified trimester: Secondary | ICD-10-CM

## 2019-05-10 NOTE — Progress Notes (Signed)
Pt reports spotting on 05/07/19 that resolved completely. Complaining of increased fatigue and pelvic pressure.  Fleet Contras RN 05/10/19

## 2019-05-10 NOTE — Patient Instructions (Signed)

## 2019-05-10 NOTE — Progress Notes (Signed)
   PRENATAL VISIT NOTE  Subjective:  Susan Huff is a 39 y.o. [phone] at [redacted]w[redacted]d being seen today for ongoing prenatal care.  She is currently monitored for the following issues for this high-risk pregnancy and has Supervision of high risk pregnancy, antepartum; Thyroid disease; Hypertension in pregnancy, pre-existing, antepartum; History of preterm delivery, currently pregnant; AMA (advanced maternal age) multigravida 35+; History of pre-eclampsia in prior pregnancy, currently pregnant; Obesity in pregnancy; Obesity, Class II, BMI 35-39.9; LGSIL on Pap smear of cervix; and Alpha thalassemia silent carrier on their problem list.  Patient reports fatigue and pressure, was evaluated at [facility] 2 days ago.  Contractions: Not present. Vag. Bleeding: Bloody Show.  Movement: Present. Denies leaking of fluid.   The following portions of the patient's history were reviewed and updated as appropriate: allergies, current medications, past family history, past medical history, past social history, past surgical history and problem list.   Objective:   Vitals:   05/10/19 1000  BP: 118/72  Pulse: 80  Weight: 189 lb (85.7 kg)    Fetal Status: Fetal Heart Rate (bpm): 150   Movement: Present     General:  Alert, oriented and cooperative. Patient is in no acute distress.  Skin: Skin is warm and dry. No rash noted.   Cardiovascular: Normal heart rate noted  Respiratory: Normal respiratory effort, no problems with respiration noted  Abdomen: Soft, gravid, appropriate for gestational age.  Pain/Pressure: Present     Pelvic: Cervical exam deferred        Extremities: Normal range of motion.  Edema: Trace  Mental Status: Normal mood and affect. Normal behavior. Normal judgment and thought content.   Assessment and Plan:  Pregnancy: [id number] at [redacted]w[redacted]d 1. Supervision of high risk pregnancy, antepartum Discomforts of pregnancy  2. Pre-existing essential hypertension during pregnancy, antepartum Good control  on Toprol XL  3. History of pre-eclampsia in prior pregnancy, currently pregnant   4. Multigravida of advanced maternal age in third trimester F/u growth. [place] dates do not conform with her history of an early pregnancy test  Preterm labor symptoms and general obstetric precautions including but not limited to vaginal bleeding, contractions, leaking of fluid and fetal movement were reviewed in detail with the patient. Please refer to After Visit Summary for other counseling recommendations.   Return in about 2 weeks (around 05/24/2019) for virtual.  No future appointments.  Scheryl Darter, MD

## 2019-05-11 LAB — CBC
Hematocrit: 35.4 % (ref 34.0–46.6)
Hemoglobin: 11.6 g/dL (ref 11.1–15.9)
MCH: 30.1 pg (ref 26.6–33.0)
MCHC: 32.8 g/dL (ref 31.5–35.7)
MCV: 92 fL (ref 79–97)
Platelets: 325 10*3/uL (ref 150–450)
RBC: 3.85 x10E6/uL (ref 3.77–5.28)
RDW: 13.4 % (ref 11.7–15.4)
WBC: 8.2 10*3/uL (ref 3.4–10.8)

## 2019-05-11 LAB — HIV ANTIBODY (ROUTINE TESTING W REFLEX): HIV Screen 4th Generation wRfx: NONREACTIVE

## 2019-05-11 LAB — GLUCOSE TOLERANCE, 2 HOURS W/ 1HR
Glucose, 1 hour: 134 mg/dL (ref 65–179)
Glucose, 2 hour: 73 mg/dL (ref 65–152)
Glucose, Fasting: 72 mg/dL (ref 65–91)

## 2019-05-11 LAB — TSH: TSH: 23.4 u[IU]/mL — ABNORMAL HIGH (ref 0.450–4.500)

## 2019-05-11 LAB — RPR: RPR Ser Ql: NONREACTIVE

## 2019-05-11 MED ORDER — LEVOTHYROXINE SODIUM 175 MCG PO TABS: 350.0000 ug | ORAL_TABLET | Freq: Every day | ORAL | 1 refills | Status: AC

## 2019-05-11 NOTE — Progress Notes (Signed)
Results for CHARA, MARQUARD (MRN [id number]) as of 05/11/2019 09:59  Ref. Range 05/10/2019 09:21  TSH Latest Ref Range: 0.450 - 4.500 uIU/mL 23.400 (H)  Increase to 350 mcg daily

## 2019-05-24 ENCOUNTER — Telehealth: Payer: Managed Care, Other (non HMO) | Admitting: Family

## 2019-05-24 ENCOUNTER — Telehealth: Payer: Self-pay | Admitting: *Deleted

## 2019-05-24 DIAGNOSIS — O0990 Supervision of high risk pregnancy, unspecified, unspecified trimester: Secondary | ICD-10-CM

## 2019-05-24 NOTE — Telephone Encounter (Signed)
Pt left message stating that she is currently [redacted] wks pregnant. She works 12 hour shifts and is having some abdominal pressure and discomfort when she is sitting or standing. When laying down she does not have pain. She wants to know if she needs to be seen. I returned pt's call and discussed her concern. I advised that what she is experiencing is normal - most likely some mild Braxton-Hicks contractions or merely discomfort related to growing baby. She does not need to be seen @ this time. Pt was advised that due to her history of preterm labor, if her discomfort becomes worse and does not ease up when lying down, she should got to hospital for evaluation. 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 I suggested that she may want to consider working 8 hour shifts if this is possible with her job and to discuss w/provider during her visit on 3/17.  Pt voiced understanding of all information and instructions given.

## 2019-05-24 NOTE — Progress Notes (Signed)
Based on what you shared with me, I feel your condition warrants further evaluation and I recommend that you be seen for a face to face office visit.   Given your symptoms of pressure and you are pregnant, you need to call you GYN's office right now and speak to the nurse!   NOTE: If you entered your credit card information for this eVisit, you will not be charged. You may see a "hold" on your card for the $35 but that hold will drop off and you will not have a charge processed.   If you are having a true medical emergency please call 911.      For an urgent face to face visit, [place] has five urgent care centers for your convenience:      NEW:  [facility] at [facility] Directions [phone] [address] [place], [place] [address] . 10 am - 6pm Monday - Friday    [facility]) Get Driving Directions [phone] [address] [place], [place] [address] . 10 am to 8 pm Monday-Friday . 12 pm to 8 pm [facility] Urgent Care at [facility] Get Driving Directions [phone] [address], [address] [place], [place] [address] . 8 am to 8 pm Monday-Friday . 9 am to 6 pm Saturday . 11 am to 6 pm Sunday     [facility] at [facility] Get Driving Directions  [phone] [address] [place], [place] [address] . 8 am to 8 pm Monday-Friday . 8 am to 4 pm [facility] at [facility] Directions [phone] [address] [place], [place] [address] . 12 pm to 6 pm Monday-Friday      Your e-visit answers were reviewed by a board certified advanced clinical practitioner to complete your personal care plan.  Thank you for using e-Visits.

## 2019-05-29 ENCOUNTER — Encounter: Payer: Self-pay | Admitting: Family Medicine

## 2019-05-29 ENCOUNTER — Telehealth (INDEPENDENT_AMBULATORY_CARE_PROVIDER_SITE_OTHER): Payer: 59 | Admitting: Obstetrics and Gynecology

## 2019-05-29 ENCOUNTER — Other Ambulatory Visit: Payer: Self-pay

## 2019-05-29 VITALS — BP 122/79 | HR 89

## 2019-05-29 DIAGNOSIS — O0933 Supervision of pregnancy with insufficient antenatal care, third trimester: Secondary | ICD-10-CM

## 2019-05-29 DIAGNOSIS — O09299 Supervision of pregnancy with other poor reproductive or obstetric history, unspecified trimester: Secondary | ICD-10-CM

## 2019-05-29 DIAGNOSIS — E079 Disorder of thyroid, unspecified: Secondary | ICD-10-CM

## 2019-05-29 DIAGNOSIS — O09523 Supervision of elderly multigravida, third trimester: Secondary | ICD-10-CM

## 2019-05-29 DIAGNOSIS — E669 Obesity, unspecified: Secondary | ICD-10-CM

## 2019-05-29 DIAGNOSIS — O0990 Supervision of high risk pregnancy, unspecified, unspecified trimester: Secondary | ICD-10-CM

## 2019-05-29 DIAGNOSIS — O99210 Obesity complicating pregnancy, unspecified trimester: Secondary | ICD-10-CM

## 2019-05-29 DIAGNOSIS — O09899 Supervision of other high risk pregnancies, unspecified trimester: Secondary | ICD-10-CM

## 2019-05-29 DIAGNOSIS — O10019 Pre-existing essential hypertension complicating pregnancy, unspecified trimester: Secondary | ICD-10-CM

## 2019-05-29 NOTE — Progress Notes (Signed)
TELEHEALTH VIRTUAL OBSTETRICS VISIT ENCOUNTER NOTE  Clinic: [facility]  I connected with Susan Huff on 05/29/19 at 10:15 AM EDT by telephone at home and verified that I am speaking with the correct person using two identifiers.   I discussed the limitations, risks, security and privacy concerns of performing an evaluation and management service by telephone and the availability of in person appointments. I also discussed with the patient that there may be a patient responsible charge related to this service. The patient expressed understanding and agreed to proceed.  Prenatal Visit Note Date: 05/29/2019 Clinic: [facility]  Subjective:  Susan Huff is a 39 y.o. [id number] at [redacted]w[redacted]d being seen today for ongoing prenatal care.  She is currently monitored for the following issues for this high-risk pregnancy and has Supervision of high risk pregnancy, antepartum; Thyroid disease; Hypertension in pregnancy, pre-existing, antepartum; History of preterm delivery, currently pregnant; AMA (advanced maternal age) multigravida 36+; History of pre-eclampsia in prior pregnancy, currently pregnant; Obesity in pregnancy; Obesity, Class II, BMI 35-39.9; LGSIL on Pap smear of cervix; and Alpha thalassemia silent carrier on their problem list.  Patient reports went to [facility] triage for preterm labor eval since last visit and was discharged from triage. No VB, LOF or decreased FM. 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     Contractions: Not present. Vag. Bleeding: Bloody Show.  Movement: Present. Denies leaking of fluid.   The following portions of the patient's history were reviewed and updated as appropriate: allergies, current medications, past family history, past medical history, past social history, past surgical history and problem list. Problem list updated.  Objective:   Vitals:   05/29/19 0958   BP: 122/79  Pulse: 89    Fetal Status:     Movement: Present    No physical exam done due to virtual visit  Urinalysis:      Assessment and Plan:  Pregnancy: [id number] at [redacted]w[redacted]d  1. Pre-existing essential hypertension during pregnancy, antepartum Continue on toprol xl 100 Start bpp next week and needs growth u/s too. Pt has missed several visits.  Pt not taking low dose asa. Too late to start - [place] MFM FETAL BPP WO NON STRESS; Future  2. History of preterm delivery, currently pregnant Not currently on anything for that. Pt declined 17p. Likely ptb due to thyroid issues  3. Supervision of high risk pregnancy, antepartum Routine care. btl papers UTD. Delivery around 39wks  4. Thyroid disease On synthroid 350 qday. Recheck TSH nv. See above  5. Multigravida of advanced maternal age in third trimester No issues  6. History of pre-eclampsia in prior pregnancy, currently pregnant  7. Obesity in pregnancy  8. Obesity, Class II, BMI 35-39.9  9. Insufficient prenatal care in third trimester  Preterm labor symptoms and general obstetric precautions including but not limited to vaginal bleeding, contractions, leaking of fluid and fetal movement were reviewed in detail with the patient.  I discussed the assessment and treatment plan with the patient. The patient was provided an opportunity to ask questions and all were answered. The patient agreed with the plan and demonstrated an understanding of the instructions. The patient was advised to call back or seek an in-person office evaluation/go to MAU at [facility] for any urgent or concerning symptoms. Please refer to After Visit Summary for other counseling recommendations.   I provided 10 minutes of non-face-to-face  time during this encounter. The visit was conducted via MyChart-medicine  Return in about 1 week (around 06/05/2019).    Bing, MD

## 2019-05-29 NOTE — Progress Notes (Signed)
I connected with  Maryan Puls on 05/29/19 at 0958 by telephone and verified that I am speaking with the correct person using two identifiers.   I discussed the limitations, risks, security and privacy concerns of performing an evaluation and management service by telephone and the availability of in person appointments. I also discussed with the patient that there may be a patient responsible charge related to this service. The patient expressed understanding and agreed to proceed.  Marjo Bicker, RN 05/29/2019  9:57 AM

## 2019-06-06 ENCOUNTER — Other Ambulatory Visit (HOSPITAL_COMMUNITY): Payer: Self-pay | Admitting: *Deleted

## 2019-06-06 ENCOUNTER — Ambulatory Visit (HOSPITAL_COMMUNITY)
Admission: RE | Admit: 2019-06-06 | Discharge: 2019-06-06 | Disposition: A | Discharge status: Home / Self Care | Payer: 59 | Source: Ambulatory Visit | Attending: Obstetrics and Gynecology | Admitting: Obstetrics and Gynecology

## 2019-06-06 ENCOUNTER — Other Ambulatory Visit: Payer: Self-pay

## 2019-06-06 ENCOUNTER — Ambulatory Visit (HOSPITAL_COMMUNITY): Payer: 59 | Admitting: *Deleted

## 2019-06-06 ENCOUNTER — Encounter (HOSPITAL_COMMUNITY): Payer: Self-pay

## 2019-06-06 DIAGNOSIS — Z3A32 32 weeks gestation of pregnancy: Secondary | ICD-10-CM

## 2019-06-06 DIAGNOSIS — Z8279 Family history of other congenital malformations, deformations and chromosomal abnormalities: Secondary | ICD-10-CM

## 2019-06-06 DIAGNOSIS — O09899 Supervision of other high risk pregnancies, unspecified trimester: Secondary | ICD-10-CM

## 2019-06-06 DIAGNOSIS — O10019 Pre-existing essential hypertension complicating pregnancy, unspecified trimester: Secondary | ICD-10-CM | POA: Insufficient documentation

## 2019-06-06 DIAGNOSIS — O0990 Supervision of high risk pregnancy, unspecified, unspecified trimester: Secondary | ICD-10-CM | POA: Insufficient documentation

## 2019-06-06 DIAGNOSIS — Z362 Encounter for other antenatal screening follow-up: Secondary | ICD-10-CM

## 2019-06-06 DIAGNOSIS — O99283 Endocrine, nutritional and metabolic diseases complicating pregnancy, third trimester: Secondary | ICD-10-CM

## 2019-06-06 DIAGNOSIS — O10919 Unspecified pre-existing hypertension complicating pregnancy, unspecified trimester: Secondary | ICD-10-CM

## 2019-06-06 DIAGNOSIS — O039 Complete or unspecified spontaneous abortion without complication: Secondary | ICD-10-CM

## 2019-06-06 DIAGNOSIS — O10013 Pre-existing essential hypertension complicating pregnancy, third trimester: Secondary | ICD-10-CM

## 2019-06-06 DIAGNOSIS — O09293 Supervision of pregnancy with other poor reproductive or obstetric history, third trimester: Secondary | ICD-10-CM

## 2019-06-06 DIAGNOSIS — O09213 Supervision of pregnancy with history of pre-term labor, third trimester: Secondary | ICD-10-CM

## 2019-06-10 ENCOUNTER — Ambulatory Visit (INDEPENDENT_AMBULATORY_CARE_PROVIDER_SITE_OTHER): Payer: 59 | Admitting: Family Medicine

## 2019-06-10 ENCOUNTER — Encounter: Payer: Self-pay | Admitting: Family Medicine

## 2019-06-10 ENCOUNTER — Other Ambulatory Visit: Payer: Self-pay

## 2019-06-10 VITALS — BP 111/74 | HR 87 | Wt 189.3 lb

## 2019-06-10 DIAGNOSIS — O09523 Supervision of elderly multigravida, third trimester: Secondary | ICD-10-CM

## 2019-06-10 DIAGNOSIS — D563 Thalassemia minor: Secondary | ICD-10-CM

## 2019-06-10 DIAGNOSIS — Z8759 Personal history of other complications of pregnancy, childbirth and the puerperium: Secondary | ICD-10-CM

## 2019-06-10 DIAGNOSIS — O26893 Other specified pregnancy related conditions, third trimester: Secondary | ICD-10-CM

## 2019-06-10 DIAGNOSIS — O09899 Supervision of other high risk pregnancies, unspecified trimester: Secondary | ICD-10-CM

## 2019-06-10 DIAGNOSIS — E669 Obesity, unspecified: Secondary | ICD-10-CM

## 2019-06-10 DIAGNOSIS — O99283 Endocrine, nutritional and metabolic diseases complicating pregnancy, third trimester: Secondary | ICD-10-CM | POA: Diagnosis not present

## 2019-06-10 DIAGNOSIS — O0990 Supervision of high risk pregnancy, unspecified, unspecified trimester: Secondary | ICD-10-CM

## 2019-06-10 DIAGNOSIS — O10019 Pre-existing essential hypertension complicating pregnancy, unspecified trimester: Secondary | ICD-10-CM

## 2019-06-10 DIAGNOSIS — R87612 Low grade squamous intraepithelial lesion on cytologic smear of cervix (LGSIL): Secondary | ICD-10-CM

## 2019-06-10 DIAGNOSIS — E079 Disorder of thyroid, unspecified: Secondary | ICD-10-CM

## 2019-06-10 DIAGNOSIS — O10013 Pre-existing essential hypertension complicating pregnancy, third trimester: Secondary | ICD-10-CM

## 2019-06-10 DIAGNOSIS — O99213 Obesity complicating pregnancy, third trimester: Secondary | ICD-10-CM

## 2019-06-10 DIAGNOSIS — O09299 Supervision of pregnancy with other poor reproductive or obstetric history, unspecified trimester: Secondary | ICD-10-CM

## 2019-06-10 MED ORDER — BETAMETHASONE SOD PHOS & ACET 6 (3-3) MG/ML IJ SUSP
12.0000 mg | Freq: Once | INTRAMUSCULAR | Status: AC
  Administered 2019-06-10: 12 mg via INTRAMUSCULAR

## 2019-06-10 NOTE — Progress Notes (Signed)
PRENATAL VISIT NOTE  Subjective:  Susan Huff is a 39 y.o. [phone] at [redacted]w[redacted]d being seen today for ongoing prenatal care.  She is currently monitored for the following issues for this high-risk pregnancy and has Supervision of high risk pregnancy, antepartum; Thyroid disease; Hypertension in pregnancy, pre-existing, antepartum; History of preterm delivery, currently pregnant; AMA (advanced maternal age) multigravida 32+; History of pre-eclampsia in prior pregnancy, currently pregnant; Obesity in pregnancy; Obesity, Class II, BMI 35-39.9; LGSIL on Pap smear of cervix; and Alpha thalassemia silent carrier on their problem list.  Patient reports cramping, pelvic pressure - esp when prolonged sitting and standing. 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 Cervix was closed. 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  Contractions: Not present. Vag. Bleeding: None.  Movement: Present. Denies leaking of fluid.   The following portions of the patient's history were reviewed and updated as appropriate: allergies, current medications, past family history, past medical history, past social history, past surgical history and problem list.   Objective:   Vitals:   06/10/19 0923  BP: 111/74  Pulse: 87  Weight: 189 lb 4.8 oz (85.9 kg)    Fetal Status: Fetal Heart Rate (bpm): 159 Fundal Height: 32 cm Movement: Present  Presentation: Vertex  General:  Alert, oriented and cooperative. Patient is in no acute distress.  Skin: Skin is warm and dry. No rash noted.   Cardiovascular: Normal heart rate noted  Respiratory: Normal respiratory effort, no problems with respiration noted  Abdomen: Soft, gravid, appropriate for gestational age.  Pain/Pressure: Present     Pelvic: Cervical exam deferred Dilation: 1.5 Effacement (%): 50 Station: -3  Extremities: Normal range of motion.  Edema: Trace  Mental Status: Normal mood and affect. Normal behavior. Normal  judgment and thought content.   Assessment and Plan:  Pregnancy: [id number] at [redacted]w[redacted]d 1. Supervision of high risk pregnancy, antepartum FHT and FH normal. Due to elevated risk of preterm labor, will place on modified best rest at home and take out of work. BMZ today and tomorrow. - TSH  2. Pre-existing essential hypertension during pregnancy, antepartum On metoprolol and asa BP controlled  3. History of preterm delivery, currently pregnant  4. Thyroid disease Difficult to control. Check TSH today - TSH  5. Multigravida of advanced maternal age in third trimester Low risk nips  6. History of pre-eclampsia in prior pregnancy, currently pregnant On ASA  7. Obesity, Class II, BMI 35-39.9  8. LGSIL on Pap smear of cervix Has not had a colposcopy. Will need colposcopy after delivery.  Preterm labor symptoms and general obstetric precautions including but not limited to vaginal bleeding, contractions, leaking of fluid and fetal movement were reviewed in detail with the patient. Please refer to After Visit Summary for other counseling recommendations.   No follow-ups on file.  Future Appointments  Date Time Provider [place]  06/11/2019  9:40 AM [place]  06/13/2019  3:45 PM [place] NURSE [place] MFC-US  06/13/2019  3:45 PM [place] 3 WH-MFCUS MFC-US  06/19/2019 11:15 AM [place], Dan Europe, DO WOC-WOCA WOC  06/20/2019  7:45 AM WH-MFC NURSE WH-MFC MFC-US  06/20/2019  7:45 AM WH-MFC [place] 2 WH-MFCUS MFC-US  06/24/2019  1:35 PM [place], DO WOC-WOCA WOC  06/27/2019 12:30 PM [place] NURSE [place] MFC-US  06/27/2019 12:30 PM [place] 1 WH-MFCUS MFC-US  07/04/2019 10:00 AM WH-MFC NURSE WH-MFC MFC-US  07/04/2019 10:00 AM [place] 3  WH-MFCUS MFC-US    Levie Heritage, DO

## 2019-06-11 ENCOUNTER — Ambulatory Visit (INDEPENDENT_AMBULATORY_CARE_PROVIDER_SITE_OTHER): Payer: 59

## 2019-06-11 ENCOUNTER — Telehealth: Payer: Self-pay

## 2019-06-11 ENCOUNTER — Other Ambulatory Visit: Payer: Self-pay | Admitting: Family Medicine

## 2019-06-11 DIAGNOSIS — O0990 Supervision of high risk pregnancy, unspecified, unspecified trimester: Secondary | ICD-10-CM

## 2019-06-11 DIAGNOSIS — O10019 Pre-existing essential hypertension complicating pregnancy, unspecified trimester: Secondary | ICD-10-CM | POA: Diagnosis not present

## 2019-06-11 DIAGNOSIS — E079 Disorder of thyroid, unspecified: Secondary | ICD-10-CM

## 2019-06-11 LAB — TSH: TSH: 7.32 u[IU]/mL — ABNORMAL HIGH (ref 0.450–4.500)

## 2019-06-11 MED ORDER — METOPROLOL SUCCINATE ER 100 MG PO TB24: 100.0000 mg | ORAL_TABLET | Freq: Every day | ORAL | 1 refills | Status: DC

## 2019-06-11 MED ORDER — BETAMETHASONE SOD PHOS & ACET 6 (3-3) MG/ML IJ SUSP
12.0000 mg | Freq: Once | INTRAMUSCULAR | Status: AC
  Administered 2019-06-11: 12 mg via INTRAMUSCULAR

## 2019-06-11 MED ORDER — LEVOTHYROXINE SODIUM 50 MCG PO TABS: 50.0000 ug | ORAL_TABLET | Freq: Every day | ORAL | 1 refills | Status: AC

## 2019-06-11 NOTE — Telephone Encounter (Signed)
Called [facility]'s endocrinology clinic . The person I spoke with stated she could not find the referral in their system as it was done today. Advised me that there is an approval process for new patients and I advised that this was an urgent request. Was asked to call back tomorrow so that I may speak to someone in scheduling referrals.

## 2019-06-11 NOTE — Progress Notes (Signed)
Patient needs an endocrinology referral for her hypothyroidism. I have increased her dose of synthroid and have placed the referral. Patient aware of both. 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 Can you see when she can get in? We need it somewhat urgently because of the pregnancy.

## 2019-06-11 NOTE — Progress Notes (Signed)
Susan Huff here for second dose of  Betamethasone  Injection.  Injection administered without complication.  Pt tolerated well.    Ralene Bathe, RN 06/11/2019  11:56 AM

## 2019-06-11 NOTE — Progress Notes (Signed)
Patient seen and assessed by nursing staff during this encounter. I have reviewed the chart and agree with the documentation and plan.  Emit Kuenzel, MD 06/11/2019 2:57 PM    

## 2019-06-12 NOTE — Telephone Encounter (Signed)
Attempted to call the endocrinology clinic to see about her referral and having patient scheduled. Stated records needed to be faxed to [phone]. States they are affiliated with [facility] and the Epic System was different.  Will forward to Admin to get Demographics and patient information sent.

## 2019-06-13 ENCOUNTER — Ambulatory Visit (HOSPITAL_COMMUNITY): Payer: 59 | Admitting: *Deleted

## 2019-06-13 ENCOUNTER — Ambulatory Visit (HOSPITAL_COMMUNITY)
Admission: RE | Admit: 2019-06-13 | Discharge: 2019-06-13 | Disposition: A | Discharge status: Home / Self Care | Payer: 59 | Source: Ambulatory Visit | Attending: Obstetrics and Gynecology | Admitting: Obstetrics and Gynecology

## 2019-06-13 ENCOUNTER — Other Ambulatory Visit: Payer: Self-pay

## 2019-06-13 DIAGNOSIS — O10919 Unspecified pre-existing hypertension complicating pregnancy, unspecified trimester: Secondary | ICD-10-CM | POA: Diagnosis not present

## 2019-06-13 DIAGNOSIS — O09899 Supervision of other high risk pregnancies, unspecified trimester: Secondary | ICD-10-CM | POA: Diagnosis present

## 2019-06-13 DIAGNOSIS — O10019 Pre-existing essential hypertension complicating pregnancy, unspecified trimester: Secondary | ICD-10-CM | POA: Diagnosis present

## 2019-06-13 DIAGNOSIS — Z8279 Family history of other congenital malformations, deformations and chromosomal abnormalities: Secondary | ICD-10-CM

## 2019-06-13 DIAGNOSIS — Z3A33 33 weeks gestation of pregnancy: Secondary | ICD-10-CM

## 2019-06-13 DIAGNOSIS — E079 Disorder of thyroid, unspecified: Secondary | ICD-10-CM

## 2019-06-13 DIAGNOSIS — O99280 Endocrine, nutritional and metabolic diseases complicating pregnancy, unspecified trimester: Secondary | ICD-10-CM | POA: Diagnosis not present

## 2019-06-13 DIAGNOSIS — O09213 Supervision of pregnancy with history of pre-term labor, third trimester: Secondary | ICD-10-CM

## 2019-06-13 DIAGNOSIS — O10913 Unspecified pre-existing hypertension complicating pregnancy, third trimester: Secondary | ICD-10-CM

## 2019-06-13 DIAGNOSIS — O09293 Supervision of pregnancy with other poor reproductive or obstetric history, third trimester: Secondary | ICD-10-CM

## 2019-06-19 ENCOUNTER — Ambulatory Visit (INDEPENDENT_AMBULATORY_CARE_PROVIDER_SITE_OTHER): Payer: 59 | Admitting: Obstetrics & Gynecology

## 2019-06-19 ENCOUNTER — Other Ambulatory Visit: Payer: Self-pay

## 2019-06-19 VITALS — BP 113/77 | HR 86 | Wt 187.0 lb

## 2019-06-19 DIAGNOSIS — Z3A34 34 weeks gestation of pregnancy: Secondary | ICD-10-CM

## 2019-06-19 DIAGNOSIS — O0990 Supervision of high risk pregnancy, unspecified, unspecified trimester: Secondary | ICD-10-CM

## 2019-06-19 DIAGNOSIS — O10013 Pre-existing essential hypertension complicating pregnancy, third trimester: Secondary | ICD-10-CM

## 2019-06-19 DIAGNOSIS — O10019 Pre-existing essential hypertension complicating pregnancy, unspecified trimester: Secondary | ICD-10-CM

## 2019-06-19 MED ORDER — TRIAMCINOLONE ACETONIDE 55 MCG/ACT NA AERO: 1.0000 | INHALATION_SPRAY | Freq: Two times a day (BID) | NASAL | 2 refills | Status: AC

## 2019-06-19 NOTE — Addendum Note (Signed)
Addended by: Raynelle Dick on: 06/19/2019 11:08 AM   Modules accepted: Orders

## 2019-06-19 NOTE — Patient Instructions (Signed)
Third Trimester of Pregnancy The third trimester is from week 28 through week 40 (months 7 through 9). The third trimester is a time when the unborn baby (fetus) is growing rapidly. At the end of the ninth month, the fetus is about 20 inches in length and weighs 6-10 pounds. Body changes during your third trimester Your body will continue to go through many changes during pregnancy. The changes vary from woman to woman. During the third trimester:  Your weight will continue to increase. You can expect to gain 25-35 pounds (11-16 kg) by the end of the pregnancy.  You may begin to get stretch marks on your hips, abdomen, and breasts.  You may urinate more often because the fetus is moving lower into your pelvis and pressing on your bladder.  You may develop or continue to have heartburn. This is caused by increased hormones that slow down muscles in the digestive tract.  You may develop or continue to have constipation because increased hormones slow digestion and cause the muscles that push waste through your intestines to relax.  You may develop hemorrhoids. These are swollen veins (varicose veins) in the rectum that can itch or be painful.  You may develop swollen, bulging veins (varicose veins) in your legs.  You may have increased body aches in the pelvis, back, or thighs. This is due to weight gain and increased hormones that are relaxing your joints.  You may have changes in your hair. These can include thickening of your hair, rapid growth, and changes in texture. Some women also have hair loss during or after pregnancy, or hair that feels dry or thin. Your hair will most likely return to normal after your baby is born.  Your breasts will continue to grow and they will continue to become tender. A yellow fluid (colostrum) may leak from your breasts. This is the first milk you are producing for your baby.  Your belly button may stick out.  You may notice more swelling in your hands,  face, or ankles.  You may have increased tingling or numbness in your hands, arms, and legs. The skin on your belly may also feel numb.  You may feel short of breath because of your expanding uterus.  You may have more problems sleeping. This can be caused by the size of your belly, increased need to urinate, and an increase in your body's metabolism.  You may notice the fetus "dropping," or moving lower in your abdomen (lightening).  You may have increased vaginal discharge.  You may notice your joints feel loose and you may have pain around your pelvic bone. What to expect at prenatal visits You will have prenatal exams every 2 weeks until week 36. Then you will have weekly prenatal exams. During a routine prenatal visit:  You will be weighed to make sure you and the baby are growing normally.  Your blood pressure will be taken.  Your abdomen will be measured to track your baby's growth.  The fetal heartbeat will be listened to.  Any test results from the previous visit will be discussed.  You may have a cervical check near your due date to see if your cervix has softened or thinned (effaced).  You will be tested for Group B streptococcus. This happens between 35 and 37 weeks. Your health care provider may ask you:  What your birth plan is.  How you are feeling.  If you are feeling the baby move.  If you have had any abnormal   symptoms, such as leaking fluid, bleeding, severe headaches, or abdominal cramping.  If you are using any tobacco products, including cigarettes, chewing tobacco, and electronic cigarettes.  If you have any questions. Other tests or screenings that may be performed during your third trimester include:  Blood tests that check for low iron levels (anemia).  Fetal testing to check the health, activity level, and growth of the fetus. Testing is done if you have certain medical conditions or if there are problems during the pregnancy.  Nonstress test  (NST). This test checks the health of your baby to make sure there are no signs of problems, such as the baby not getting enough oxygen. During this test, a belt is placed around your belly. The baby is made to move, and its heart rate is monitored during movement. What is false labor? False labor is a condition in which you feel small, irregular tightenings of the muscles in the womb (contractions) that usually go away with rest, changing position, or drinking water. These are called Braxton Hicks contractions. Contractions may last for hours, days, or even weeks before true labor sets in. If contractions come at regular intervals, become more frequent, increase in intensity, or become painful, you should see your health care provider. What are the signs of labor?  Abdominal cramps.  Regular contractions that start at 10 minutes apart and become stronger and more frequent with time.  Contractions that start on the top of the uterus and spread down to the lower abdomen and back.  Increased pelvic pressure and dull back pain.  A watery or bloody mucus discharge that comes from the vagina.  Leaking of amniotic fluid. This is also known as your "water breaking." It could be a slow trickle or a gush. Let your health care provider know if it has a color or strange odor. If you have any of these signs, call your health care provider right away, even if it is before your due date. Follow these instructions at home: Medicines  Follow your health care provider's instructions regarding medicine use. Specific medicines may be either safe or unsafe to take during pregnancy.  Take a prenatal vitamin that contains at least 600 micrograms (mcg) of folic acid.  If you develop constipation, try taking a stool softener if your health care provider approves. Eating and drinking   Eat a balanced diet that includes fresh fruits and vegetables, whole grains, good sources of protein such as meat, eggs, or tofu,  and low-fat dairy. Your health care provider will help you determine the amount of weight gain that is right for you.  Avoid raw meat and uncooked cheese. These carry germs that can cause birth defects in the baby.  If you have low calcium intake from food, talk to your health care provider about whether you should take a daily calcium supplement.  Eat four or five small meals rather than three large meals a day.  Limit foods that are high in fat and processed sugars, such as fried and sweet foods.  To prevent constipation: ? Drink enough fluid to keep your urine clear or pale yellow. ? Eat foods that are high in fiber, such as fresh fruits and vegetables, whole grains, and beans. Activity  Exercise only as directed by your health care provider. Most women can continue their usual exercise routine during pregnancy. Try to exercise for 30 minutes at least 5 days a week. Stop exercising if you experience uterine contractions.  Avoid heavy lifting.  Do   not exercise in extreme heat or humidity, or at high altitudes.  Wear low-heel, comfortable shoes.  Practice good posture.  You may continue to have sex unless your health care provider tells you otherwise. Relieving pain and discomfort  Take frequent breaks and rest with your legs elevated if you have leg cramps or low back pain.  Take warm sitz baths to soothe any pain or discomfort caused by hemorrhoids. Use hemorrhoid cream if your health care provider approves.  Wear a good support bra to prevent discomfort from breast tenderness.  If you develop varicose veins: ? Wear support pantyhose or compression stockings as told by your healthcare provider. ? Elevate your feet for 15 minutes, 3-4 times a day. Prenatal care  Write down your questions. Take them to your prenatal visits.  Keep all your prenatal visits as told by your health care provider. This is important. Safety  Wear your seat belt at all times when driving.  Make  a list of emergency phone numbers, including numbers for family, friends, the hospital, and police and fire departments. General instructions  Avoid cat litter boxes and soil used by cats. These carry germs that can cause birth defects in the baby. If you have a cat, ask someone to clean the litter box for you.  Do not travel far distances unless it is absolutely necessary and only with the approval of your health care provider.  Do not use hot tubs, steam rooms, or saunas.  Do not drink alcohol.  Do not use any products that contain nicotine or tobacco, such as cigarettes and e-cigarettes. If you need help quitting, ask your health care provider.  Do not use any medicinal herbs or unprescribed drugs. These chemicals affect the formation and growth of the baby.  Do not douche or use tampons or scented sanitary pads.  Do not cross your legs for long periods of time.  To prepare for the arrival of your baby: ? Take prenatal classes to understand, practice, and ask questions about labor and delivery. ? Make a trial run to the hospital. ? Visit the hospital and tour the maternity area. ? Arrange for maternity or paternity leave through employers. ? Arrange for family and friends to take care of pets while you are in the hospital. ? Purchase a rear-facing car seat and make sure you know how to install it in your car. ? Pack your hospital bag. ? Prepare the baby's nursery. Make sure to remove all pillows and stuffed animals from the baby's crib to prevent suffocation.  Visit your dentist if you have not gone during your pregnancy. Use a soft toothbrush to brush your teeth and be gentle when you floss. Contact a health care provider if:  You are unsure if you are in labor or if your water has broken.  You become dizzy.  You have mild pelvic cramps, pelvic pressure, or nagging pain in your abdominal area.  You have lower back pain.  You have persistent nausea, vomiting, or  diarrhea.  You have an unusual or bad smelling vaginal discharge.  You have pain when you urinate. Get help right away if:  Your water breaks before 37 weeks.  You have regular contractions less than 5 minutes apart before 37 weeks.  You have a fever.  You are leaking fluid from your vagina.  You have spotting or bleeding from your vagina.  You have severe abdominal pain or cramping.  You have rapid weight loss or weight gain.  You have   shortness of breath with chest pain.  You notice sudden or extreme swelling of your face, hands, ankles, feet, or legs.  Your baby makes fewer than 10 movements in 2 hours.  You have severe headaches that do not go away when you take medicine.  You have vision changes. Summary  The third trimester is from week 28 through week 40, months 7 through 9. The third trimester is a time when the unborn baby (fetus) is growing rapidly.  During the third trimester, your discomfort may increase as you and your baby continue to gain weight. You may have abdominal, leg, and back pain, sleeping problems, and an increased need to urinate.  During the third trimester your breasts will keep growing and they will continue to become tender. A yellow fluid (colostrum) may leak from your breasts. This is the first milk you are producing for your baby.  False labor is a condition in which you feel small, irregular tightenings of the muscles in the womb (contractions) that eventually go away. These are called Braxton Hicks contractions. Contractions may last for hours, days, or even weeks before true labor sets in.  Signs of labor can include: abdominal cramps; regular contractions that start at 10 minutes apart and become stronger and more frequent with time; watery or bloody mucus discharge that comes from the vagina; increased pelvic pressure and dull back pain; and leaking of amniotic fluid. This information is not intended to replace advice given to you by your  health care provider. Make sure you discuss any questions you have with your health care provider. Document Revised: 06/21/2018 Document Reviewed: 04/05/2016 Elsevier Patient Education  2020 [employer]  

## 2019-06-19 NOTE — Progress Notes (Signed)
Subjective:    Susan Huff is a 39 y.o. [id number] [redacted]w[redacted]d being seen today for her obstetrical visit.  Patient reports no bleeding, no contractions, no cramping and no leaking. Fetal movement: normal. Denies HA, RUQ pain, vision changes.   Objective:    BP 113/77   Pulse 86   Wt 84.8 kg   LMP 10/09/2018   BMI 34.20 kg/m   Physical Exam  Vitals reviewed. Constitutional: She is oriented to person, place, and time. She appears well-developed and well-nourished.  HENT:  Head: Normocephalic and atraumatic.  Eyes: Pupils are equal, round, and reactive to light.  Cardiovascular: Normal rate.  Respiratory: Effort normal.  GI: Soft. She exhibits no distension. There is no abdominal tenderness. There is no guarding.  Musculoskeletal:        General: No edema.     Cervical back: Normal range of motion.  Neurological: She is alert and oriented to person, place, and time.  Skin: Skin is warm and dry.  Psychiatric: She has a normal mood and affect. Her behavior is normal.    Exam  FHT: Fetal Heart Rate (bpm): 139  Uterine Size:  34  Presentation:  vertex     Assessment:    Pregnancy:  [id number]    Plan:    Patient Active Problem List   Diagnosis Date Noted  . Alpha thalassemia silent carrier 04/17/2019  . LGSIL on Pap smear of cervix 03/05/2019  . AMA (advanced maternal age) multigravida 35+ 02/20/2019  . History of pre-eclampsia in prior pregnancy, currently pregnant 02/20/2019  . Obesity in pregnancy 02/20/2019  . Obesity, Class II, BMI 35-39.9 02/20/2019  . Hypertension in pregnancy, pre-existing, antepartum 01/23/2019  . History of preterm delivery, currently pregnant 01/23/2019  . Thyroid disease   . Supervision of high risk pregnancy, antepartum 12/18/2018    Precautions including hypertension, fetal movement, PTL discussed Follow up in 1 Week.

## 2019-06-20 ENCOUNTER — Ambulatory Visit (HOSPITAL_COMMUNITY)
Admission: RE | Admit: 2019-06-20 | Discharge: 2019-06-20 | Disposition: A | Discharge status: Home / Self Care | Payer: 59 | Source: Ambulatory Visit | Attending: Obstetrics and Gynecology | Admitting: Obstetrics and Gynecology

## 2019-06-20 ENCOUNTER — Ambulatory Visit (HOSPITAL_COMMUNITY): Payer: 59 | Admitting: *Deleted

## 2019-06-20 ENCOUNTER — Encounter (HOSPITAL_COMMUNITY): Payer: Self-pay

## 2019-06-20 DIAGNOSIS — O10019 Pre-existing essential hypertension complicating pregnancy, unspecified trimester: Secondary | ICD-10-CM

## 2019-06-20 DIAGNOSIS — O10919 Unspecified pre-existing hypertension complicating pregnancy, unspecified trimester: Secondary | ICD-10-CM | POA: Diagnosis present

## 2019-06-20 DIAGNOSIS — O09899 Supervision of other high risk pregnancies, unspecified trimester: Secondary | ICD-10-CM

## 2019-06-20 DIAGNOSIS — Z3A34 34 weeks gestation of pregnancy: Secondary | ICD-10-CM

## 2019-06-20 DIAGNOSIS — R079 Chest pain, unspecified: Secondary | ICD-10-CM | POA: Diagnosis not present

## 2019-06-20 DIAGNOSIS — O09299 Supervision of pregnancy with other poor reproductive or obstetric history, unspecified trimester: Secondary | ICD-10-CM

## 2019-06-20 DIAGNOSIS — Z8279 Family history of other congenital malformations, deformations and chromosomal abnormalities: Secondary | ICD-10-CM

## 2019-06-20 DIAGNOSIS — O99280 Endocrine, nutritional and metabolic diseases complicating pregnancy, unspecified trimester: Secondary | ICD-10-CM | POA: Diagnosis not present

## 2019-06-20 DIAGNOSIS — O09219 Supervision of pregnancy with history of pre-term labor, unspecified trimester: Secondary | ICD-10-CM

## 2019-06-24 ENCOUNTER — Encounter: Payer: Self-pay | Admitting: Family Medicine

## 2019-06-24 ENCOUNTER — Ambulatory Visit (INDEPENDENT_AMBULATORY_CARE_PROVIDER_SITE_OTHER): Payer: 59 | Admitting: Family Medicine

## 2019-06-24 ENCOUNTER — Other Ambulatory Visit: Payer: Self-pay

## 2019-06-24 VITALS — BP 127/83 | HR 94 | Wt 183.0 lb

## 2019-06-24 DIAGNOSIS — O0990 Supervision of high risk pregnancy, unspecified, unspecified trimester: Secondary | ICD-10-CM

## 2019-06-24 DIAGNOSIS — O99283 Endocrine, nutritional and metabolic diseases complicating pregnancy, third trimester: Secondary | ICD-10-CM

## 2019-06-24 DIAGNOSIS — E079 Disorder of thyroid, unspecified: Secondary | ICD-10-CM

## 2019-06-24 DIAGNOSIS — Z3A34 34 weeks gestation of pregnancy: Secondary | ICD-10-CM

## 2019-06-24 DIAGNOSIS — O09523 Supervision of elderly multigravida, third trimester: Secondary | ICD-10-CM

## 2019-06-24 DIAGNOSIS — O10019 Pre-existing essential hypertension complicating pregnancy, unspecified trimester: Secondary | ICD-10-CM

## 2019-06-24 DIAGNOSIS — D563 Thalassemia minor: Secondary | ICD-10-CM

## 2019-06-24 DIAGNOSIS — R87612 Low grade squamous intraepithelial lesion on cytologic smear of cervix (LGSIL): Secondary | ICD-10-CM

## 2019-06-24 DIAGNOSIS — O10013 Pre-existing essential hypertension complicating pregnancy, third trimester: Secondary | ICD-10-CM

## 2019-06-24 NOTE — Progress Notes (Signed)
   PRENATAL VISIT NOTE  Subjective:  Susan Huff is a 39 y.o. [id number] at [redacted]w[redacted]d being seen today for ongoing prenatal care.  She is currently monitored for the following issues for this high-risk pregnancy and has Supervision of high risk pregnancy, antepartum; Thyroid disease; Hypertension in pregnancy, pre-existing, antepartum; History of preterm delivery, currently pregnant; AMA (advanced maternal age) multigravida 35+; History of pre-eclampsia in prior pregnancy, currently pregnant; Obesity in pregnancy; Obesity, Class II, BMI 35-39.9; LGSIL on Pap smear of cervix; and Alpha thalassemia silent carrier on their problem list.  Patient reports more energy with the increased dose of synthroid. No racing heart rate, palpitations, heat intolerance..  Contractions: Not present. Vag. Bleeding: None.  Movement: Present. Denies leaking of fluid.   The following portions of the patient's history were reviewed and updated as appropriate: allergies, current medications, past family history, past medical history, past social history, past surgical history and problem list.   Objective:   Vitals:   06/24/19 1348  BP: 127/83  Pulse: 94  Weight: 183 lb (83 kg)    Fetal Status: Fetal Heart Rate (bpm): 141 Fundal Height: 35 cm Movement: Present     General:  Alert, oriented and cooperative. Patient is in no acute distress.  Skin: Skin is warm and dry. No rash noted.   Cardiovascular: Normal heart rate noted  Respiratory: Normal respiratory effort, no problems with respiration noted  Abdomen: Soft, gravid, appropriate for gestational age.  Pain/Pressure: Absent     Pelvic: Cervical exam deferred        Extremities: Normal range of motion.  Edema: None  Mental Status: Normal mood and affect. Normal behavior. Normal judgment and thought content.   Assessment and Plan:  Pregnancy: [id number] at [redacted]w[redacted]d 1. Supervision of high risk pregnancy, antepartum FHT and FH normal. 36 weeks labs next appt  2.  Benign essential hypertension, antepartum Controlled Continue asa 81mg . Continue antenatal testing Delivery at 39 weeks  3. Thyroid disease Continue synthroid.   4. Multigravida of advanced maternal age in third trimester Delivery at 17 weeks  5. LGSIL on Pap smear of cervix  6. Alpha thalassemia silent carrier   Preterm labor symptoms and general obstetric precautions including but not limited to vaginal bleeding, contractions, leaking of fluid and fetal movement were reviewed in detail with the patient. Please refer to After Visit Summary for other counseling recommendations.   Return in about 2 weeks (around 07/08/2019) for OB f/u, GBS, In Office.  Future Appointments  Date Time Provider Department Center  06/27/2019 12:30 PM [facility] NURSE [facility]  06/27/2019 12:30 PM [facility] 06/29/2019 1 [facility]  07/04/2019 10:00 AM [facility] NURSE [facility]  07/04/2019 10:00 AM [facility] 07/06/2019 3 [facility]    [place], DO

## 2019-06-26 DIAGNOSIS — Z029 Encounter for administrative examinations, unspecified: Secondary | ICD-10-CM

## 2019-06-27 ENCOUNTER — Ambulatory Visit (HOSPITAL_COMMUNITY): Payer: 59

## 2019-06-27 ENCOUNTER — Ambulatory Visit (HOSPITAL_COMMUNITY): Payer: 59 | Attending: Obstetrics and Gynecology

## 2019-07-04 ENCOUNTER — Encounter (HOSPITAL_COMMUNITY): Payer: Self-pay | Admitting: *Deleted

## 2019-07-04 ENCOUNTER — Other Ambulatory Visit: Payer: Self-pay

## 2019-07-04 ENCOUNTER — Encounter (HOSPITAL_COMMUNITY): Payer: Self-pay

## 2019-07-04 ENCOUNTER — Ambulatory Visit (HOSPITAL_COMMUNITY)
Admission: RE | Admit: 2019-07-04 | Discharge: 2019-07-04 | Disposition: A | Discharge status: Home / Self Care | Payer: 59 | Source: Ambulatory Visit | Attending: Obstetrics and Gynecology | Admitting: Obstetrics and Gynecology

## 2019-07-04 ENCOUNTER — Telehealth (HOSPITAL_COMMUNITY): Payer: Self-pay | Admitting: *Deleted

## 2019-07-04 ENCOUNTER — Ambulatory Visit (HOSPITAL_COMMUNITY): Payer: 59 | Admitting: *Deleted

## 2019-07-04 ENCOUNTER — Other Ambulatory Visit (HOSPITAL_COMMUNITY): Payer: Self-pay | Admitting: Obstetrics

## 2019-07-04 DIAGNOSIS — O10919 Unspecified pre-existing hypertension complicating pregnancy, unspecified trimester: Secondary | ICD-10-CM

## 2019-07-04 DIAGNOSIS — O09293 Supervision of pregnancy with other poor reproductive or obstetric history, third trimester: Secondary | ICD-10-CM

## 2019-07-04 DIAGNOSIS — O99283 Endocrine, nutritional and metabolic diseases complicating pregnancy, third trimester: Secondary | ICD-10-CM

## 2019-07-04 DIAGNOSIS — Z3A36 36 weeks gestation of pregnancy: Secondary | ICD-10-CM

## 2019-07-04 DIAGNOSIS — O10013 Pre-existing essential hypertension complicating pregnancy, third trimester: Secondary | ICD-10-CM

## 2019-07-04 DIAGNOSIS — O09213 Supervision of pregnancy with history of pre-term labor, third trimester: Secondary | ICD-10-CM

## 2019-07-04 DIAGNOSIS — O09899 Supervision of other high risk pregnancies, unspecified trimester: Secondary | ICD-10-CM | POA: Insufficient documentation

## 2019-07-04 DIAGNOSIS — Z8279 Family history of other congenital malformations, deformations and chromosomal abnormalities: Secondary | ICD-10-CM

## 2019-07-04 DIAGNOSIS — E079 Disorder of thyroid, unspecified: Secondary | ICD-10-CM

## 2019-07-04 IMAGING — US US MFM FETAL BPP W/O NON-STRESS
1 series · 13 of 28 positions shown · non-contrast
Comparison: none

[Series 1: us mfm fetal bpp w/o non-stress · 59 acquisitions, 13 frames shown]
[im 3/59]
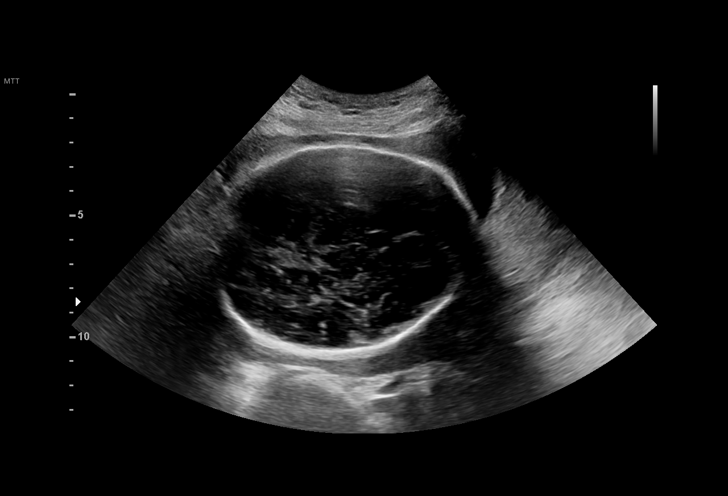
[im 7/59]
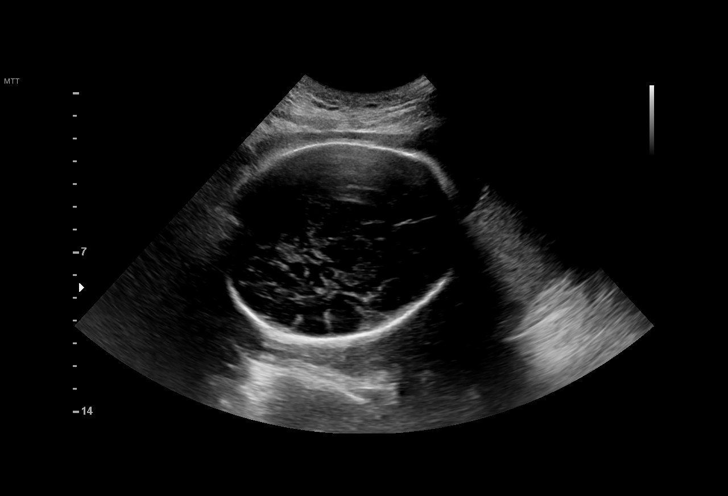
[im 11/59]
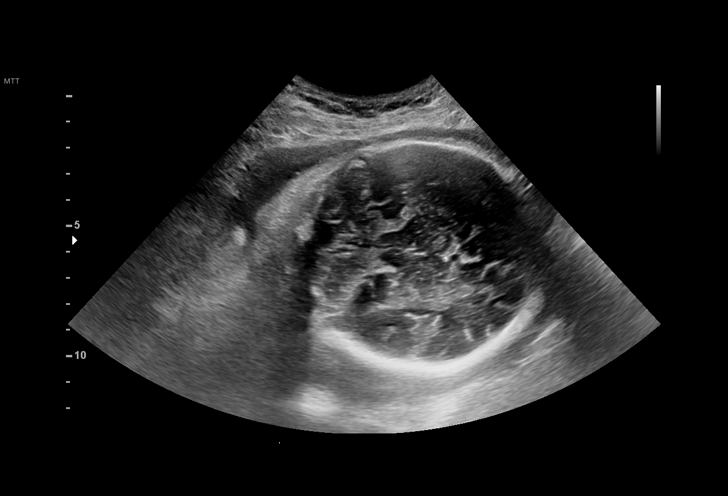
[im 16/59]
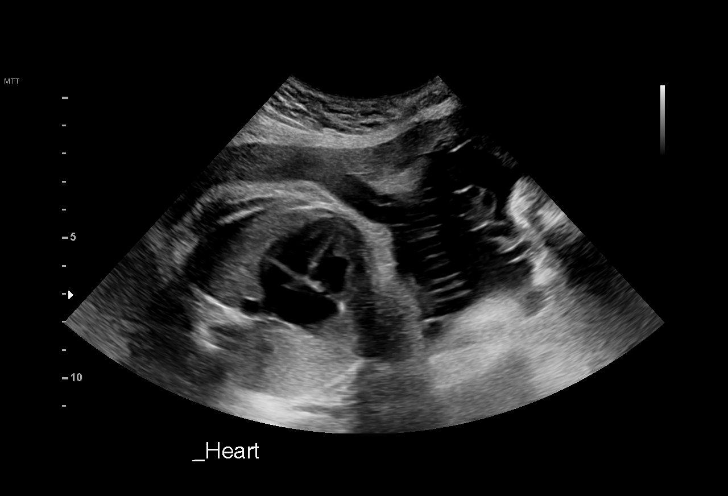
[im 20/59]
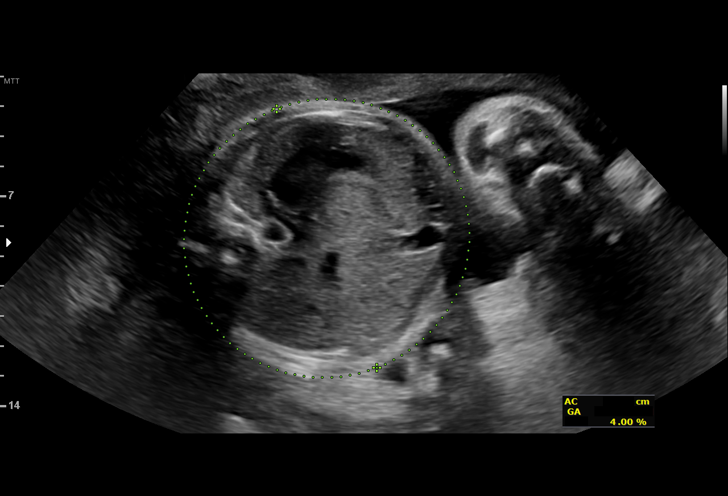
[im 24/59]
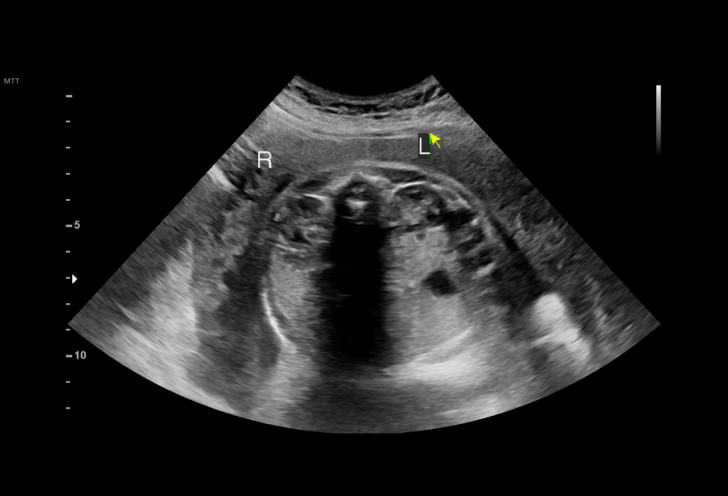
[im 31/59]
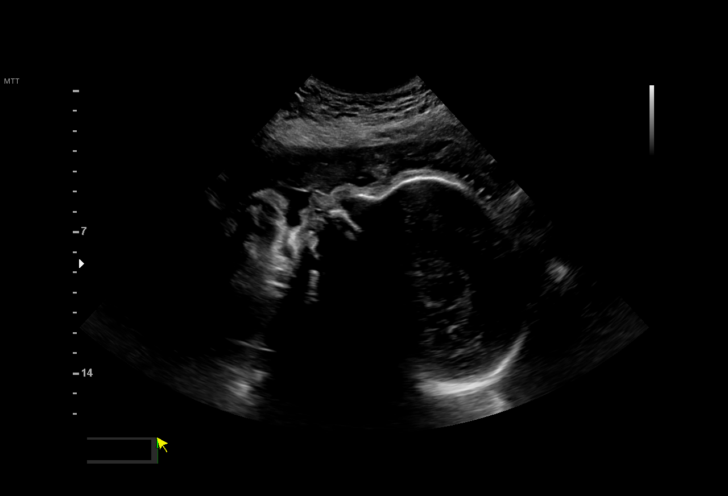
[im 35/59]
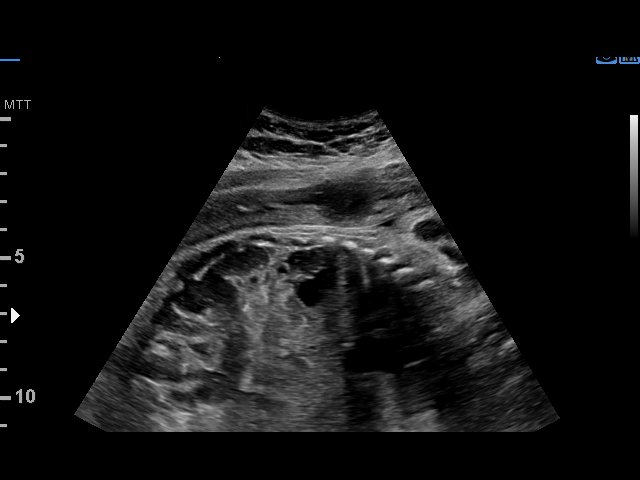
[im 39/59]
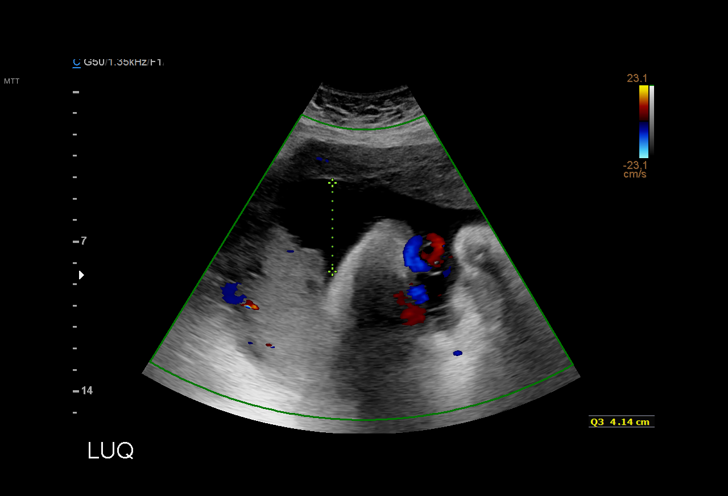
[im 43/59]
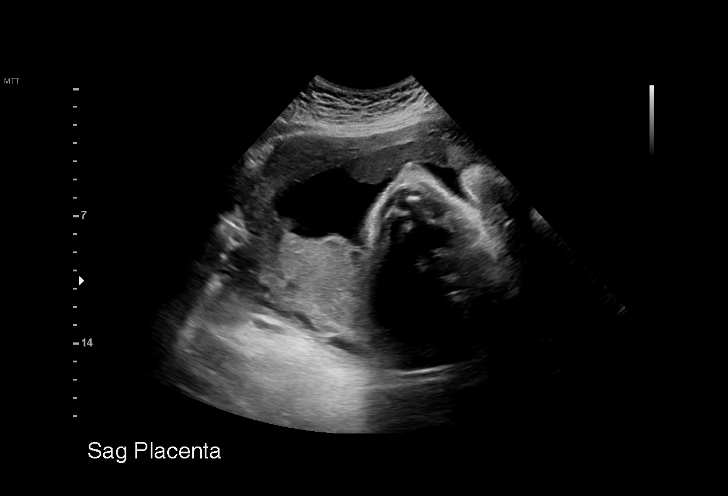
[im 48/59]
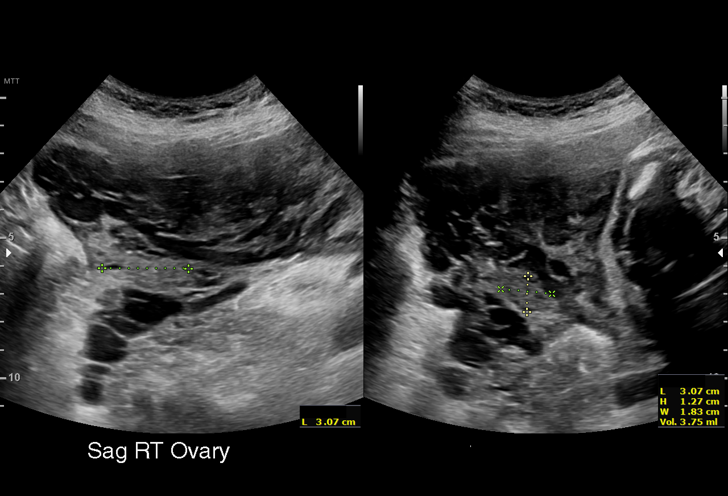
[im 52/59]
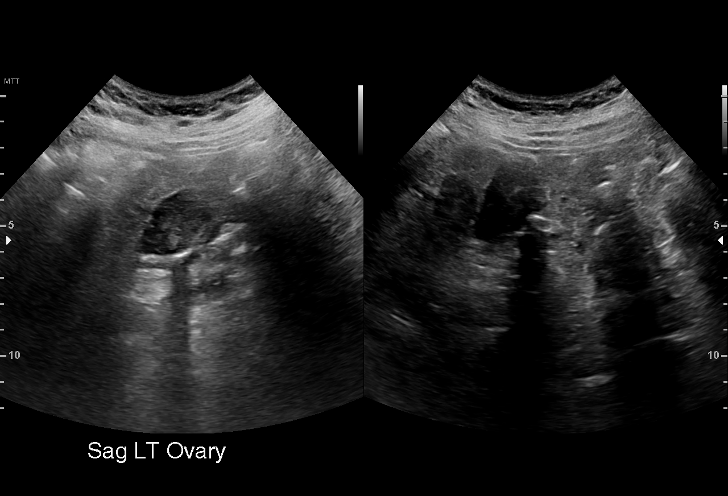
[im 56/59]
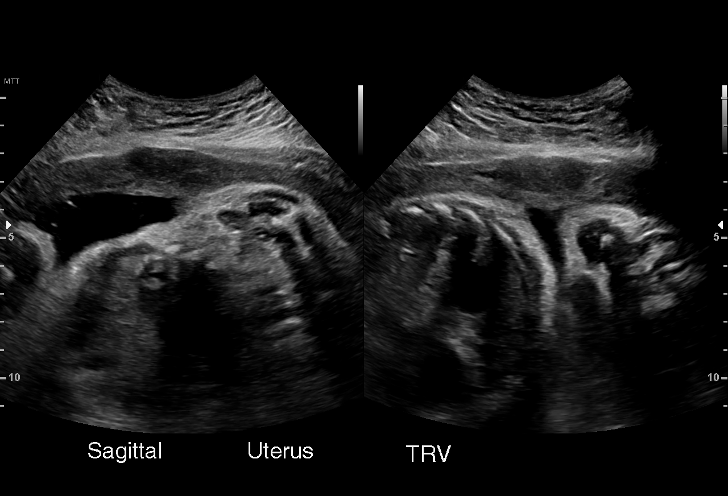

[13 of 28 positions shown; findings below may reference images not displayed]

[address]

 ----------------------------------------------------------------------

 ----------------------------------------------------------------------
Indications

  36 weeks gestation of pregnancy
  Hypertension - Chronic/Pre-existing            [FQ]
  Poor obstetric history: Previous               [FQ]
  preeclampsia / eclampsia/gestational HTN
  (IOL at [FQ])
  Thyroid disease in pregnancy                   O99.280, [FQ]
  Family history of congenital anomaly           [FQ]
  (Hydrocephalus)
  Poor obstetric history: Previous preterm       [FQ]
  delivery x 2 (PPROM at 32 weeks)
  Low risk NIPS, AFP neg
 ----------------------------------------------------------------------
Fetal Evaluation

 Num Of Fetuses:         1
 Fetal Heart Rate(bpm):  134
 Cardiac Activity:       Observed
 Presentation:           Cephalic
 Placenta:               Posterior
 P. Cord Insertion:      Previously Visualized

 Amniotic Fluid
 AFI FV:      Within normal limits

 AFI Sum(cm)     %Tile       Largest Pocket(cm)
 15.04           55          5

 RUQ(cm)       RLQ(cm)       LUQ(cm)        LLQ(cm)
 2.95          2.97          5
Biophysical Evaluation

 Amniotic F.V:   Pocket => 2 cm             F. Tone:        Observed
 F. Movement:    Observed                   Score:          [DATE]
 F. Breathing:   Observed
Biometry

 BPD:      85.4  mm     G. Age:  34w 3d         14  %    CI:        76.65   %    70 - 86
                                                         FL/HC:      19.4   %    20.1 -
 HC:       309   mm     G. Age:  34w 3d          2  %    HC/AC:      1.05        0.93 -
 AC:       293   mm     G. Age:  33w 2d          3  %    FL/BPD:     70.3   %    71 - 87
 FL:         60  mm     G. Age:  31w 2d        < 1  %    FL/AC:      20.5   %    20 - 24
 CER:      53.3  mm     G. Age:  N/A          > 95  %

 LV:        2.9  mm

 Est. FW:    [FQ]  gm      4 lb 9 oz      2  %
OB History

 Gravidity:    6         Term:   2        Prem:   2        SAB:   1
 TOP:          0       Ectopic:  0        Living: 4
Gestational Age

 U/S Today:     33w 3d                                        EDD:   [DATE]
 Best:          36w 1d     Det. By:  U/S  ([DATE])          EDD:   [DATE]
Anatomy

 Cranium:               Appears normal         LVOT:                   Previously seen
 Cavum:                 Previously seen        Aortic Arch:            Previously seen
 Ventricles:            Appears normal         Ductal Arch:            Previously seen
 Choroid Plexus:        Previously seen        Diaphragm:              Appears normal
 Cerebellum:            Previously seen        Stomach:                Appears normal, left
                                                                       sided
 Posterior Fossa:       Previously seen        Abdomen:                Previously seen
 Nuchal Fold:           Previously seen        Abdominal Wall:         Previously seen
 Face:                  Orbits and profile     Cord Vessels:           Previously seen
                        previously seen
 Lips:                  Previously seen        Kidneys:                Appear normal
 Palate:                Not well visualized    Bladder:                Appears normal
 Thoracic:              Previously seen        Spine:                  Previously seen
 Heart:                 Previously seen        Upper Extremities:      Previously seen
 RVOT:                  Previously seen        Lower Extremities:      Previously seen

 Other:  Fetus appears to be female. Rt heel visualized previously. Nasal
         bone visualized previously.  Technically difficult due to advanced GA
         and fetal position.
Doppler - Fetal Vessels

 Umbilical Artery
  S/D     %tile     RI              PI                     ADFV    RDFV
 2.63       65   0.62             0.97                        No      No

Cervix Uterus Adnexa
 Cervix
 Not visualized (advanced GA >[FQ])

 Uterus
 Multiple fibroids noted, see table below.

 Left Ovary
 Within normal limits. No adnexal mass visualized.

 Right Ovary
 Not visualized. No adnexal mass visualized.

 Cul De Sac
 No free fluid seen.

 Adnexa
 No abnormality visualized.
Impression

 Chronic hypertension.  Patient takes metoprolol 100 mg daily.
 Blood pressure today at our office is 120/81 mmHg.  She also
 has hypothyroidism (thyroidectomy in [FQ]) and takes
 levothyroxine 400 mcg daily.
 On today's ultrasound, amniotic fluid is normal good fetal
 activity seen.  Estimated fetal weight is at the 2nd percentile.
 Interval weight gain 398 g over 4 weeks (suboptimal).
 Abdominal circumference measurement is at the 2nd
 percentile.Antenatal testing is reassuring. BPP [DATE]. Umbilical
 artery Doppler showed normal forward diastolic flow.

 I explained the finding of severe fetal growth restriction that is
 associated with increased perinatal morbidity and mortality.
 Delivery at 37 weeks gestation increases neonatal risks
 including respiratory distress syndrome.I have recommended
 delivery at 37 weeks gestation because of fetal risks
 hopefully neonatal benefits.

 Patient agreed to be delivered at 37 weeks gestation.  I
 discussed with Dr. MATEEJ be scheduling induction of labor
 next week.
                 MATEEJ

## 2019-07-04 NOTE — Telephone Encounter (Signed)
Preadmission screen  

## 2019-07-08 ENCOUNTER — Other Ambulatory Visit: Payer: Self-pay | Admitting: Family Medicine

## 2019-07-08 DIAGNOSIS — O365930 Maternal care for other known or suspected poor fetal growth, third trimester, not applicable or unspecified: Secondary | ICD-10-CM | POA: Insufficient documentation

## 2019-07-08 NOTE — Progress Notes (Signed)
IOL 2/2 FGR <2% on 4/22 [place] by MFM. Orders placed  Marlowe Alt, DO [facility] 07/08/2019 6:58 PM

## 2019-07-10 ENCOUNTER — Other Ambulatory Visit (HOSPITAL_COMMUNITY)
Admission: RE | Admit: 2019-07-10 | Discharge: 2019-07-10 | Disposition: A | Discharge status: Home / Self Care | Payer: 59 | Source: Ambulatory Visit | Attending: Family Medicine | Admitting: Family Medicine

## 2019-07-10 ENCOUNTER — Other Ambulatory Visit: Payer: Self-pay | Admitting: Advanced Practice Midwife

## 2019-07-10 ENCOUNTER — Encounter: Payer: 59 | Admitting: Obstetrics & Gynecology

## 2019-07-10 DIAGNOSIS — Z20822 Contact with and (suspected) exposure to covid-19: Secondary | ICD-10-CM | POA: Insufficient documentation

## 2019-07-10 DIAGNOSIS — Z01812 Encounter for preprocedural laboratory examination: Secondary | ICD-10-CM | POA: Insufficient documentation

## 2019-07-10 LAB — SARS CORONAVIRUS 2 (TAT 6-24 HRS): SARS Coronavirus 2: NEGATIVE

## 2019-07-12 ENCOUNTER — Inpatient Hospital Stay (HOSPITAL_COMMUNITY)
Admission: AD | Admit: 2019-07-12 | Discharge: 2019-07-15 | DRG: 784 | Disposition: A | Discharge status: Home / Self Care | Payer: 59 | Attending: Obstetrics and Gynecology | Admitting: Obstetrics and Gynecology

## 2019-07-12 ENCOUNTER — Inpatient Hospital Stay (HOSPITAL_COMMUNITY): Payer: 59

## 2019-07-12 ENCOUNTER — Other Ambulatory Visit: Payer: Self-pay

## 2019-07-12 ENCOUNTER — Encounter (HOSPITAL_COMMUNITY)
Admission: AD | Disposition: A | Discharge status: Home / Self Care | Payer: Self-pay | Source: Home / Self Care | Attending: Obstetrics and Gynecology

## 2019-07-12 ENCOUNTER — Encounter (HOSPITAL_COMMUNITY): Payer: Self-pay | Admitting: Obstetrics and Gynecology

## 2019-07-12 ENCOUNTER — Inpatient Hospital Stay (HOSPITAL_COMMUNITY): Payer: 59 | Admitting: Anesthesiology

## 2019-07-12 DIAGNOSIS — Z87891 Personal history of nicotine dependence: Secondary | ICD-10-CM | POA: Diagnosis not present

## 2019-07-12 DIAGNOSIS — R87612 Low grade squamous intraepithelial lesion on cytologic smear of cervix (LGSIL): Secondary | ICD-10-CM | POA: Diagnosis present

## 2019-07-12 DIAGNOSIS — O365930 Maternal care for other known or suspected poor fetal growth, third trimester, not applicable or unspecified: Secondary | ICD-10-CM | POA: Diagnosis present

## 2019-07-12 DIAGNOSIS — O99284 Endocrine, nutritional and metabolic diseases complicating childbirth: Secondary | ICD-10-CM | POA: Diagnosis present

## 2019-07-12 DIAGNOSIS — O99214 Obesity complicating childbirth: Secondary | ICD-10-CM | POA: Diagnosis present

## 2019-07-12 DIAGNOSIS — E669 Obesity, unspecified: Secondary | ICD-10-CM | POA: Diagnosis present

## 2019-07-12 DIAGNOSIS — Z3009 Encounter for other general counseling and advice on contraception: Secondary | ICD-10-CM

## 2019-07-12 DIAGNOSIS — Z20822 Contact with and (suspected) exposure to covid-19: Secondary | ICD-10-CM | POA: Diagnosis present

## 2019-07-12 DIAGNOSIS — O99210 Obesity complicating pregnancy, unspecified trimester: Secondary | ICD-10-CM | POA: Diagnosis present

## 2019-07-12 DIAGNOSIS — E039 Hypothyroidism, unspecified: Secondary | ICD-10-CM | POA: Diagnosis present

## 2019-07-12 DIAGNOSIS — D563 Thalassemia minor: Secondary | ICD-10-CM | POA: Diagnosis present

## 2019-07-12 DIAGNOSIS — Z3490 Encounter for supervision of normal pregnancy, unspecified, unspecified trimester: Secondary | ICD-10-CM | POA: Diagnosis present

## 2019-07-12 DIAGNOSIS — O09529 Supervision of elderly multigravida, unspecified trimester: Secondary | ICD-10-CM

## 2019-07-12 DIAGNOSIS — O0990 Supervision of high risk pregnancy, unspecified, unspecified trimester: Secondary | ICD-10-CM

## 2019-07-12 DIAGNOSIS — Z302 Encounter for sterilization: Secondary | ICD-10-CM | POA: Diagnosis not present

## 2019-07-12 DIAGNOSIS — Z3A37 37 weeks gestation of pregnancy: Secondary | ICD-10-CM | POA: Diagnosis not present

## 2019-07-12 DIAGNOSIS — O10919 Unspecified pre-existing hypertension complicating pregnancy, unspecified trimester: Secondary | ICD-10-CM | POA: Diagnosis present

## 2019-07-12 DIAGNOSIS — O09899 Supervision of other high risk pregnancies, unspecified trimester: Secondary | ICD-10-CM

## 2019-07-12 DIAGNOSIS — O1002 Pre-existing essential hypertension complicating childbirth: Secondary | ICD-10-CM | POA: Diagnosis present

## 2019-07-12 DIAGNOSIS — E079 Disorder of thyroid, unspecified: Secondary | ICD-10-CM | POA: Diagnosis present

## 2019-07-12 DIAGNOSIS — O09299 Supervision of pregnancy with other poor reproductive or obstetric history, unspecified trimester: Secondary | ICD-10-CM

## 2019-07-12 HISTORY — PX: TUBAL LIGATION: SHX77

## 2019-07-12 LAB — CBC
HCT: 41 % (ref 36.0–46.0)
HCT: 36.9 % (ref 36.0–46.0)
Hemoglobin: 13.9 g/dL (ref 12.0–15.0)
Hemoglobin: 12.4 g/dL (ref 12.0–15.0)
MCH: 31.4 pg (ref 26.0–34.0)
MCH: 30.5 pg (ref 26.0–34.0)
MCHC: 33.9 g/dL (ref 30.0–36.0)
MCHC: 33.6 g/dL (ref 30.0–36.0)
MCV: 92.6 fL (ref 80.0–100.0)
MCV: 90.7 fL (ref 80.0–100.0)
Platelets: 288 10*3/uL (ref 150–400)
Platelets: 257 10*3/uL (ref 150–400)
RBC: 4.43 MIL/uL (ref 3.87–5.11)
RBC: 4.07 MIL/uL (ref 3.87–5.11)
RDW: 13.2 % (ref 11.5–15.5)
RDW: 12.9 % (ref 11.5–15.5)
WBC: 6.2 10*3/uL (ref 4.0–10.5)
WBC: 7.9 10*3/uL (ref 4.0–10.5)
nRBC: 0 % (ref 0.0–0.2)
nRBC: 0 % (ref 0.0–0.2)

## 2019-07-12 LAB — COMPREHENSIVE METABOLIC PANEL
ALT: 12 U/L (ref 0–44)
AST: 16 U/L (ref 15–41)
Albumin: 2.5 g/dL — ABNORMAL LOW (ref 3.5–5.0)
Alkaline Phosphatase: 206 U/L — ABNORMAL HIGH (ref 38–126)
Anion gap: 12 (ref 5–15)
BUN: 5 mg/dL — ABNORMAL LOW (ref 6–20)
CO2: 18 mmol/L — ABNORMAL LOW (ref 22–32)
Calcium: 8.3 mg/dL — ABNORMAL LOW (ref 8.9–10.3)
Chloride: 105 mmol/L (ref 98–111)
Creatinine, Ser: 0.53 mg/dL (ref 0.44–1.00)
GFR calc Af Amer: >60 mL/min (ref >60)
GFR calc non Af Amer: >60 mL/min (ref >60)
Glucose, Bld: 74 mg/dL (ref 70–99)
Potassium: 3.6 mmol/L (ref 3.5–5.1)
Sodium: 135 mmol/L (ref 135–145)
Total Bilirubin: 0.5 mg/dL (ref 0.3–1.2)
Total Protein: 6.1 g/dL — ABNORMAL LOW (ref 6.5–8.1)

## 2019-07-12 LAB — TYPE AND SCREEN
ABO/RH(D): A POS
Antibody Screen: NEGATIVE

## 2019-07-12 LAB — ABO/RH: ABO/RH(D): A POS

## 2019-07-12 LAB — PROTEIN / CREATININE RATIO, URINE
Creatinine, Urine: 36.98 mg/dL
Protein Creatinine Ratio: 0.24 mg/mg{Cre} — ABNORMAL HIGH (ref 0.00–0.15)
Total Protein, Urine: 9 mg/dL

## 2019-07-12 LAB — RPR: RPR Ser Ql: NONREACTIVE

## 2019-07-12 LAB — GROUP B STREP BY PCR: Group B strep by PCR: NEGATIVE

## 2019-07-12 SURGERY — Surgical Case
Anesthesia: Epidural

## 2019-07-12 MED ORDER — CEFAZOLIN SODIUM-DEXTROSE 2-4 GM/100ML-% IV SOLN
2.0000 g | INTRAVENOUS | Status: AC
  Administered 2019-07-12: 22:00:00 2 g via INTRAVENOUS

## 2019-07-12 MED ORDER — FENTANYL-BUPIVACAINE-NACL 0.5-0.125-0.9 MG/250ML-% EP SOLN
12.0000 mL/h | EPIDURAL | Status: DC | PRN
  Filled 2019-07-12: qty 250

## 2019-07-12 MED ORDER — PHENYLEPHRINE 40 MCG/ML (10ML) SYRINGE FOR IV PUSH (FOR BLOOD PRESSURE SUPPORT)
80.0000 ug | PREFILLED_SYRINGE | INTRAVENOUS | Status: DC | PRN
  Filled 2019-07-12: qty 10

## 2019-07-12 MED ORDER — SOD CITRATE-CITRIC ACID 500-334 MG/5ML PO SOLN
30.0000 mL | ORAL | Status: AC
  Administered 2019-07-12: 30 mL via ORAL

## 2019-07-12 MED ORDER — DEXAMETHASONE SODIUM PHOSPHATE 10 MG/ML IJ SOLN
INTRAMUSCULAR | Status: DC | PRN
  Administered 2019-07-12: 10 mg via INTRAVENOUS

## 2019-07-12 MED ORDER — STERILE WATER FOR IRRIGATION IR SOLN
Status: DC | PRN
  Administered 2019-07-12: 1000 mL

## 2019-07-12 MED ORDER — ONDANSETRON HCL 4 MG/2ML IJ SOLN
INTRAMUSCULAR | Status: AC
  Filled 2019-07-12: qty 2

## 2019-07-12 MED ORDER — MORPHINE SULFATE (PF) 0.5 MG/ML IJ SOLN
INTRAMUSCULAR | Status: DC | PRN
  Administered 2019-07-12: 3 mg via EPIDURAL

## 2019-07-12 MED ORDER — LACTATED RINGERS IV SOLN: 500.0000 mL | INTRAVENOUS | Status: DC | PRN

## 2019-07-12 MED ORDER — ONDANSETRON HCL 4 MG/2ML IJ SOLN
4.0000 mg | Freq: Four times a day (QID) | INTRAMUSCULAR | Status: DC | PRN
  Administered 2019-07-12: 23:00:00 4 mg via INTRAVENOUS

## 2019-07-12 MED ORDER — FENTANYL CITRATE (PF) 100 MCG/2ML IJ SOLN
INTRAMUSCULAR | Status: AC
  Filled 2019-07-12: qty 2

## 2019-07-12 MED ORDER — ACETAMINOPHEN 325 MG PO TABS: 650.0000 mg | ORAL_TABLET | ORAL | Status: DC | PRN

## 2019-07-12 MED ORDER — DEXMEDETOMIDINE HCL IN NACL 200 MCG/50ML IV SOLN
INTRAVENOUS | Status: DC | PRN
  Administered 2019-07-12 (×4): 4 ug via INTRAVENOUS

## 2019-07-12 MED ORDER — CEFAZOLIN SODIUM-DEXTROSE 2-4 GM/100ML-% IV SOLN
INTRAVENOUS | Status: AC
  Filled 2019-07-12: qty 100

## 2019-07-12 MED ORDER — OXYTOCIN 40 UNITS IN NORMAL SALINE INFUSION - SIMPLE MED
1.0000 m[IU]/min | INTRAVENOUS | Status: DC
  Administered 2019-07-12: 6 m[IU]/min via INTRAVENOUS
  Administered 2019-07-12: 4 m[IU]/min via INTRAVENOUS
  Administered 2019-07-12: 2 m[IU]/min via INTRAVENOUS
  Filled 2019-07-12: qty 1000

## 2019-07-12 MED ORDER — KETAMINE HCL 10 MG/ML IJ SOLN
INTRAMUSCULAR | Status: DC | PRN
Start: 2019-07-12 — End: 2019-07-12
  Administered 2019-07-12 (×5): 10 mg via INTRAVENOUS

## 2019-07-12 MED ORDER — DIPHENHYDRAMINE HCL 50 MG/ML IJ SOLN: 12.5000 mg | INTRAMUSCULAR | Status: DC | PRN

## 2019-07-12 MED ORDER — SODIUM CHLORIDE (PF) 0.9 % IJ SOLN
INTRAMUSCULAR | Status: DC | PRN
  Administered 2019-07-12: 12 mL/h via EPIDURAL

## 2019-07-12 MED ORDER — LEVOTHYROXINE SODIUM 100 MCG PO TABS
400.0000 ug | ORAL_TABLET | Freq: Every day | ORAL | Status: DC
  Administered 2019-07-13 – 2019-07-15 (×3): 400 ug via ORAL
  Filled 2019-07-12: qty 4
  Filled 2019-07-12: qty 8
  Filled 2019-07-12: qty 2
  Filled 2019-07-12: qty 4

## 2019-07-12 MED ORDER — OXYTOCIN BOLUS FROM INFUSION: 500.0000 mL | Freq: Once | INTRAVENOUS | Status: DC

## 2019-07-12 MED ORDER — SODIUM CHLORIDE 0.9 % IR SOLN
Status: DC | PRN
  Administered 2019-07-12: 1

## 2019-07-12 MED ORDER — LACTATED RINGERS IV SOLN: INTRAVENOUS | Status: DC

## 2019-07-12 MED ORDER — FENTANYL CITRATE (PF) 250 MCG/5ML IJ SOLN
INTRAMUSCULAR | Status: DC | PRN
  Administered 2019-07-12: 100 ug via INTRAVENOUS

## 2019-07-12 MED ORDER — FENTANYL CITRATE (PF) 100 MCG/2ML IJ SOLN
INTRAMUSCULAR | Status: DC | PRN
  Administered 2019-07-12: 100 ug via EPIDURAL

## 2019-07-12 MED ORDER — LACTATED RINGERS IV SOLN: 500.0000 mL | Freq: Once | INTRAVENOUS | Status: DC

## 2019-07-12 MED ORDER — HYDRALAZINE HCL 20 MG/ML IJ SOLN: 10.0000 mg | INTRAMUSCULAR | Status: DC | PRN

## 2019-07-12 MED ORDER — LIDOCAINE HCL (PF) 1 % IJ SOLN
30.0000 mL | INTRAMUSCULAR | Status: AC | PRN
  Administered 2019-07-12: 5 mL via SUBCUTANEOUS
  Administered 2019-07-12: 7 mL via SUBCUTANEOUS

## 2019-07-12 MED ORDER — TERBUTALINE SULFATE 1 MG/ML IJ SOLN
0.2500 mg | Freq: Once | INTRAMUSCULAR | Status: AC | PRN
  Administered 2019-07-12: 0.25 mg via SUBCUTANEOUS
  Filled 2019-07-12: qty 1

## 2019-07-12 MED ORDER — EPHEDRINE 5 MG/ML INJ
10.0000 mg | INTRAVENOUS | Status: DC | PRN
  Filled 2019-07-12: qty 2

## 2019-07-12 MED ORDER — OXYTOCIN 40 UNITS IN NORMAL SALINE INFUSION - SIMPLE MED
INTRAVENOUS | Status: AC
  Filled 2019-07-12: qty 1000

## 2019-07-12 MED ORDER — DEXAMETHASONE SODIUM PHOSPHATE 10 MG/ML IJ SOLN
INTRAMUSCULAR | Status: AC
  Filled 2019-07-12: qty 1

## 2019-07-12 MED ORDER — DEXMEDETOMIDINE HCL IN NACL 200 MCG/50ML IV SOLN
INTRAVENOUS | Status: AC
  Filled 2019-07-12: qty 50

## 2019-07-12 MED ORDER — CHLOROPROCAINE HCL (PF) 3 % IJ SOLN
INTRAMUSCULAR | Status: DC | PRN
  Administered 2019-07-12: 20 mL

## 2019-07-12 MED ORDER — MAGNESIUM SULFATE 40 GM/1000ML IV SOLN: 2.0000 g/h | INTRAVENOUS | Status: DC

## 2019-07-12 MED ORDER — LABETALOL HCL 5 MG/ML IV SOLN: 80.0000 mg | INTRAVENOUS | Status: DC | PRN

## 2019-07-12 MED ORDER — OXYCODONE-ACETAMINOPHEN 5-325 MG PO TABS: 2.0000 | ORAL_TABLET | ORAL | Status: DC | PRN

## 2019-07-12 MED ORDER — OXYCODONE-ACETAMINOPHEN 5-325 MG PO TABS: 1.0000 | ORAL_TABLET | ORAL | Status: DC | PRN

## 2019-07-12 MED ORDER — OXYTOCIN 40 UNITS IN NORMAL SALINE INFUSION - SIMPLE MED
2.5000 [IU]/h | INTRAVENOUS | Status: DC
  Administered 2019-07-12: 40 [IU]/h via INTRAVENOUS

## 2019-07-12 MED ORDER — LABETALOL HCL 5 MG/ML IV SOLN: 40.0000 mg | INTRAVENOUS | Status: DC | PRN

## 2019-07-12 MED ORDER — LIDOCAINE-EPINEPHRINE 2 %-1:100000 IJ SOLN
INTRAMUSCULAR | Status: DC | PRN
  Administered 2019-07-12: 2 mL via INTRADERMAL
  Administered 2019-07-12: 5 mL via INTRADERMAL
  Administered 2019-07-12: 3 mL via INTRADERMAL

## 2019-07-12 MED ORDER — MAGNESIUM SULFATE BOLUS VIA INFUSION
4.0000 g | Freq: Once | INTRAVENOUS | Status: DC
  Filled 2019-07-12: qty 1000

## 2019-07-12 MED ORDER — SODIUM BICARBONATE 8.4 % IV SOLN
INTRAVENOUS | Status: DC | PRN
  Administered 2019-07-12: 5 mL via EPIDURAL

## 2019-07-12 MED ORDER — MORPHINE SULFATE (PF) 0.5 MG/ML IJ SOLN
INTRAMUSCULAR | Status: AC
  Filled 2019-07-12: qty 10

## 2019-07-12 MED ORDER — KETAMINE HCL 50 MG/5ML IJ SOSY
PREFILLED_SYRINGE | INTRAMUSCULAR | Status: AC
  Filled 2019-07-12: qty 5

## 2019-07-12 MED ORDER — CHLOROPROCAINE HCL (PF) 3 % IJ SOLN
INTRAMUSCULAR | Status: AC
  Filled 2019-07-12: qty 20

## 2019-07-12 MED ORDER — SOD CITRATE-CITRIC ACID 500-334 MG/5ML PO SOLN
30.0000 mL | ORAL | Status: DC | PRN
  Filled 2019-07-12: qty 30

## 2019-07-12 MED ORDER — LIDOCAINE-EPINEPHRINE (PF) 2 %-1:200000 IJ SOLN
INTRAMUSCULAR | Status: AC
  Filled 2019-07-12: qty 10

## 2019-07-12 MED ORDER — LABETALOL HCL 5 MG/ML IV SOLN: 20.0000 mg | INTRAVENOUS | Status: DC | PRN

## 2019-07-12 MED ORDER — METOPROLOL SUCCINATE ER 100 MG PO TB24
100.0000 mg | ORAL_TABLET | Freq: Every day | ORAL | Status: DC
  Administered 2019-07-12 – 2019-07-14 (×3): 100 mg via ORAL
  Filled 2019-07-12 (×3): qty 1

## 2019-07-12 SURGICAL SUPPLY — 40 items
BENZOIN TINCTURE PRP APPL 2/3 (GAUZE/BANDAGES/DRESSINGS) ×4 IMPLANT
CHLORAPREP W/TINT 26ML (MISCELLANEOUS) ×4 IMPLANT
CLAMP CORD UMBIL (MISCELLANEOUS) IMPLANT
CLIP FILSHIE TUBAL LIGA STRL (Clip) ×4 IMPLANT
CLOSURE STERI STRIP 1/2 X4 (GAUZE/BANDAGES/DRESSINGS) ×4 IMPLANT
CLOSURE WOUND 1/2 X4 (GAUZE/BANDAGES/DRESSINGS)
CLOTH BEACON ORANGE TIMEOUT ST (SAFETY) ×4 IMPLANT
DRSG OPSITE POSTOP 4X10 (GAUZE/BANDAGES/DRESSINGS) ×4 IMPLANT
ELECT REM PT RETURN 9FT ADLT (ELECTROSURGICAL) ×4
ELECTRODE REM PT RTRN 9FT ADLT (ELECTROSURGICAL) ×2 IMPLANT
EXTRACTOR VACUUM KIWI (MISCELLANEOUS) IMPLANT
GLOVE BIOGEL PI IND STRL 7.0 (GLOVE) ×2 IMPLANT
GLOVE BIOGEL PI IND STRL 9 (GLOVE) ×2 IMPLANT
GLOVE BIOGEL PI INDICATOR 7.0 (GLOVE) ×2
GLOVE BIOGEL PI INDICATOR 9 (GLOVE) ×2
GLOVE SS PI 9.0 STRL (GLOVE) ×4 IMPLANT
GOWN STRL REUS W/TWL 2XL LVL3 (GOWN DISPOSABLE) ×4 IMPLANT
GOWN STRL REUS W/TWL LRG LVL3 (GOWN DISPOSABLE) ×4 IMPLANT
NEEDLE HYPO 25X5/8 SAFETYGLIDE (NEEDLE) IMPLANT
NS IRRIG 1000ML POUR BTL (IV SOLUTION) ×4 IMPLANT
PACK C SECTION WH (CUSTOM PROCEDURE TRAY) ×4 IMPLANT
PAD ABD 7.5X8 STRL (GAUZE/BANDAGES/DRESSINGS) ×4 IMPLANT
PAD OB MATERNITY 4.3X12.25 (PERSONAL CARE ITEMS) ×4 IMPLANT
PENCIL SMOKE EVAC W/HOLSTER (ELECTROSURGICAL) ×4 IMPLANT
RTRCTR C-SECT PINK 25CM LRG (MISCELLANEOUS) IMPLANT
RTRCTR C-SECT PINK 34CM XLRG (MISCELLANEOUS) IMPLANT
STRIP CLOSURE SKIN 1/2X4 (GAUZE/BANDAGES/DRESSINGS) IMPLANT
SUT MNCRL 0 VIOLET CTX 36 (SUTURE) ×4 IMPLANT
SUT MONOCRYL 0 CTX 36 (SUTURE) ×4
SUT PLAIN 2 0 (SUTURE) ×2
SUT PLAIN ABS 2-0 CT1 27XMFL (SUTURE) ×2 IMPLANT
SUT VIC AB 0 CT1 27 (SUTURE) ×2
SUT VIC AB 0 CT1 27XBRD ANBCTR (SUTURE) ×2 IMPLANT
SUT VIC AB 2-0 CT1 27 (SUTURE) ×2
SUT VIC AB 2-0 CT1 TAPERPNT 27 (SUTURE) ×2 IMPLANT
SUT VIC AB 4-0 KS 27 (SUTURE) ×4 IMPLANT
SYR BULB IRRIGATION 50ML (SYRINGE) IMPLANT
TOWEL OR 17X24 6PK STRL BLUE (TOWEL DISPOSABLE) ×4 IMPLANT
TRAY FOLEY W/BAG SLVR 14FR LF (SET/KITS/TRAYS/PACK) ×4 IMPLANT
WATER STERILE IRR 1000ML POUR (IV SOLUTION) ×4 IMPLANT

## 2019-07-12 NOTE — Progress Notes (Signed)
LABOR PROGRESS NOTE  Susan Huff is a 39 y.o. [id number] at [redacted]w[redacted]d  admitted for IOL for IUGR 2%..  Subjective: Starting to feel contractions but not yet super painful  Objective: BP 124/74   Pulse (!) 58   Temp 98 F (36.7 C) (Oral)   Resp 16   Ht 5\' 2"  (1.575 m)   Wt 83 kg   LMP 10/09/2018   BMI 33.47 kg/m  or  Vitals:   07/12/19 1409 07/12/19 1447 07/12/19 1530 07/12/19 1559  BP: 126/81 130/81 118/69 124/74  Pulse: 62 (!) 59 (!) 58 (!) 58  Resp:  18 16 16   Temp:      TempSrc:      Weight:      Height:         Dilation: 3 Effacement (%): Thick Cervical Position: Posterior Station: -3 Presentation: Undeterminable Exam by:: Lima, rn FHT: baseline rate 140, moderate varibility, +acel, variable decel Toco: poor tracing  Labs: Lab Results  Component Value Date   WBC 6.2 07/12/2019   HGB 13.9 07/12/2019   HCT 41.0 07/12/2019   MCV 92.6 07/12/2019   PLT 288 07/12/2019    Patient Active Problem List   Diagnosis Date Noted  . Encounter for induction of labor 07/12/2019  . Intrauterine growth restriction (IUGR) affecting care of mother, third trimester, single gestation 07/08/2019  . Alpha thalassemia silent carrier 04/17/2019  . LGSIL on Pap smear of cervix 03/05/2019  . AMA (advanced maternal age) multigravida 35+ 02/20/2019  . History of pre-eclampsia in prior pregnancy, currently pregnant 02/20/2019  . Obesity in pregnancy 02/20/2019  . Obesity, Class II, BMI 35-39.9 02/20/2019  . Hypertension in pregnancy, pre-existing, antepartum 01/23/2019  . History of preterm delivery, currently pregnant 01/23/2019  . Thyroid disease   . Supervision of high risk pregnancy, antepartum 12/18/2018    Assessment / Plan: 39 y.o. 02/17/2019 at [redacted]w[redacted]d here for IOL IUGR 2%.  Labor: started on pitocin earlier this morning. Around 1245 had deep prolonged variables of unclear duration due to discontinuous tracing and pitocin was stopped for 1 hour. Discussed with patient that if  these are recurrent may need to consider CS due to fetal intolerance of labor but will try again. Pitocin resumed at 1338, has intermittently had variable decels but on the whole reassuring, cont pitocin and consider AROM at next exam.  Fetal Wellbeing:  Cat II at present but reassuring for moderate variability and accels Pain Control:  IV pain meds PRN, planning epidural GBS: neg Anticipated MOD:  SVD  MOC: BTL, papers signed 05/10/2019  cHTN: Cont Metoprolol 100 mg, baseline labs pending  Hypothyroidism: cont Synthroid 400 mcg  [redacted]w[redacted]d, MD/MPH OB Fellow  07/12/2019, 4:28 PM

## 2019-07-12 NOTE — Progress Notes (Signed)
Called by RN for prolonged fetal decerations into the 90s.  Pitocin stopped and terbutaline given. HR recovered to baseline of 145 bpm.  Discussed with Dr. Emelda Fear, for this IUGR baby at 2%ile, 2nd time pitocin has had to be stopped due to [facility] and prolonged fetal decelerations.  Due to fetal intolerance of labor, recommend primary Cesarean section.  The risks of cesarean section were discussed with the patient including but were not limited to: bleeding which may require transfusion or reoperation; infection which may require antibiotics; injury to bowel, bladder, ureters or other surrounding organs; injury to the fetus; need for additional procedures including hysterectomy in the event of a life-threatening hemorrhage; placental abnormalities wth subsequent pregnancies, incisional problems, thromboembolic phenomenon and other postoperative/anesthesia complications.    Patient also desires permanent sterilization.  Other reversible forms of contraception were discussed with patient; she declines all other modalities. Risks of procedure discussed with patient including but not limited to: risk of regret, permanence of method, bleeding, infection, injury to surrounding organs and need for additional procedures.  Failure risk of about 1% with increased risk of ectopic gestation if pregnancy occurs was also discussed with patient.  Also discussed possibility of post-tubal pain syndrome. The patient concurred with the proposed plan, giving informed written consent for the procedures.  Patient has been NPO since 1300 she will remain NPO for procedure. Anesthesia and OR aware.  Preoperative prophylactic antibiotics and SCDs ordered on call to the OR.  To OR when ready.  Marlowe Alt, DO OB Fellow, [facility] 07/12/2019 10:06 PM

## 2019-07-12 NOTE — Anesthesia Procedure Notes (Signed)
Epidural Patient location during procedure: OB Start time: 07/12/2019 5:32 PM End time: 07/12/2019 5:35 PM  Staffing Anesthesiologist: Leilani Able, MD Performed: anesthesiologist   Preanesthetic Checklist Completed: patient identified, IV checked, site marked, risks and benefits discussed, surgical consent, monitors and equipment checked, pre-op evaluation and timeout performed  Epidural Patient position: sitting Prep: DuraPrep and site prepped and draped Patient monitoring: continuous pulse ox and blood pressure Approach: midline Location: L3-L4 Injection technique: LOR air  Needle:  Needle type: Tuohy  Needle gauge: 17 G Needle length: 9 cm and 9 Needle insertion depth: 6 cm Catheter type: closed end flexible Catheter size: 19 Gauge Catheter at skin depth: 11 cm Test dose: negative and Other  Assessment Events: blood not aspirated, injection not painful, no injection resistance, no paresthesia and negative IV test  Additional Notes Reason for block:procedure for pain

## 2019-07-12 NOTE — H&P (Signed)
LABOR AND DELIVERY ADMISSION HISTORY AND PHYSICAL NOTE  Susan Huff is a 39 y.o. female [phone] with IUP at 87w2dby 20wk UKoreapresenting for IUGR 2%.   She reports positive fetal movement. She denies leakage of fluid, vaginal bleeding, or contractions.   She plans on breast feeding. Her contraception plan is: BTL, papers signed 05/10/2019.  [facility] , CWD, normal anatomy, cephalic presentation, posterior placenta, 2%ile, EFW 2078 grams  Pregnancy complications:  - IUGR - Hypothyroidism, on Synthroid 4070m daily - cHTN, on Metoprolol XL 100 mg - AMA - LGSIL on prenatal pap, needs colpo postpartum  Past Medical History: Past Medical History:  Diagnosis Date  . Complication of anesthesia    in dental office; had to be resuscitate and sent to  hospital  . Hypertension   . Hypothyroidism   . Preterm labor   . Thyroid disease     Past Surgical History: Past Surgical History:  Procedure Laterality Date  . DILATION AND CURETTAGE OF UTERUS    . THYROIDECTOMY  2008  . [place]  2008    Obstetrical History: OB History    Gravida  6   Para  4   Term  2   Preterm  2   AB  1   Living  4     SAB      TAB      Ectopic      Multiple      Live Births  4           Social History: Social History   Socioeconomic History  . Marital status: Single    Spouse name: Not on file  . Number of children: Not on file  . Years of education: Not on file  . Highest education level: Not on file  Occupational History  . Not on file  Tobacco Use  . Smoking status: Former Smoker    Types: Cigarettes    Quit date: 01/22/2017    Years since quitting: 2.4  . Smokeless tobacco: Never Used  Substance and Sexual Activity  . Alcohol use: Never  . Drug use: Never  . Sexual activity: Yes    Birth control/protection: None  Other Topics Concern  . Not on file  Social History Narrative  . Not on file    Social Determinants of Health   Financial Resource Strain:   . Difficulty of Paying Living Expenses:   Food Insecurity: No Food Insecurity  . Worried About RuCharity fundraisern the Last Year: Never true  . Ran Out of Food in the Last Year: Never true  Transportation Needs: No Transportation Needs  . Lack of Transportation (Medical): No  . Lack of Transportation (Non-Medical): No  Physical Activity:   . Days of Exercise per Week:   . Minutes of Exercise per Session:   Stress:   . Feeling of Stress :   Social Connections:   . Frequency of Communication with Friends and Family:   . Frequency of Social Gatherings with Friends and Family:   . Attends Religious Services:   . Active Member of Clubs or Organizations:   . Attends ClArchivisteetings:   . Marland Kitchenarital Status:     Family History: Family History  Adopted: Yes    Allergies: Allergies  Allergen Reactions  . Sulfa Antibiotics Rash    Medications Prior to Admission  Medication Sig Dispense Refill Last Dose  . Blood Pressure Monitoring (BLOOD PRESSURE  KIT) DEVI 1 Device by Does not apply route as needed. 1 Device 0   . diphenhydrAMINE (BENADRYL) 25 MG tablet Take 25 mg by mouth every 6 (six) hours as needed.     Marland Kitchen levothyroxine (SYNTHROID) 175 MCG tablet Take 2 tablets (350 mcg total) by mouth daily before breakfast. 60 tablet 1   . levothyroxine (SYNTHROID) 50 MCG tablet Take 1 tablet (50 mcg total) by mouth daily before breakfast. In addition to 347mg for 4050m total 30 tablet 1   . metoprolol succinate (TOPROL-XL) 100 MG 24 hr tablet Take 1 tablet (100 mg total) by mouth daily. Take with or immediately following a meal. 30 tablet 1   . Prenatal Vit-Fe Fumarate-FA (PRENATAL VITAMIN PO) Take 1 tablet by mouth daily.     . Marland Kitchenriamcinolone (NASACORT) 55 MCG/ACT AERO nasal inhaler Place 1 spray into the nose 2 (two) times daily. (Patient not taking: Reported on 06/20/2019) 1 Inhaler 2      Review of Systems  All  systems reviewed and negative except as stated in HPI  Physical Exam Blood pressure 128/86, pulse 69, temperature 98 F (36.7 C), temperature source Oral, resp. rate 18, last menstrual period 10/09/2018. General appearance: alert, oriented, NAD Lungs: normal respiratory effort Heart: regular rate Abdomen: soft, non-tender; gravid Extremities: No calf swelling or tenderness Presentation: cephalic by RN SVE  Fetal monitoringBaseline: 135 bpm, Variability: Good {> 6 bpm), Accelerations: Reactive and Decelerations: Absent Uterine activity: rare     Prenatal labs: ABO, Rh: A/Positive/-- (12/09 1212) Antibody: Negative (12/09 1212) Rubella: 5.78 (12/09 1212) RPR: Non Reactive (02/26 0921)  HBsAg: Negative (12/09 1212)  HIV: Non Reactive (02/26 0921)  GC/Chlamydia: unknown/neg 02/2019, repeat pending  GBS:   unknown 2-hr GTT: normal 05/10/2019 ([phone] screening:  Low risk Panorama Anatomy [place]GR, otherwise normal  Prenatal Transfer Tool  Maternal Diabetes: No Genetic Screening: Normal Maternal Ultrasounds/Referrals: IUGR Fetal Ultrasounds or other Referrals:  None Maternal Substance Abuse:  No Significant Maternal Medications:  Meds include: Syntroid Other: Metoprolol Significant Maternal Lab Results: None  No results found for this or any previous visit (from the past 24 hour(s)).  Patient Active Problem List   Diagnosis Date Noted  . Encounter for induction of labor 07/12/2019  . Intrauterine growth restriction (IUGR) affecting care of mother, third trimester, single gestation 07/08/2019  . Alpha thalassemia silent carrier 04/17/2019  . LGSIL on Pap smear of cervix 03/05/2019  . AMA (advanced maternal age) multigravida 35+ 02/20/2019  . History of pre-eclampsia in prior pregnancy, currently pregnant 02/20/2019  . Obesity in pregnancy 02/20/2019  . Obesity, Class II, BMI 35-39.9 02/20/2019  . Hypertension in pregnancy, pre-existing, antepartum 01/23/2019  .  History of preterm delivery, currently pregnant 01/23/2019  . Thyroid disease   . Supervision of high risk pregnancy, antepartum 12/18/2018    Assessment: Susan Hoggards a [age].o. [id number] 3754w2dre for IOL for IUGR 2%.  #Labor: start pitocin 2x2, AROM once in adequate pattern #Pain: IV pain meds PRN, plans on epidural #FWB: Cat I #GBS/ID: Unknown GBS, PCR and culture ordered. No 3rd trimester GN/CT, ordered.  #COVID: swab negative 07/10/2019 #MOF: Breast #MOC: BTL, papers signed 05/10/2019 #Circ: n/a  #cHTN: Cont Metoprolol 100 mg, baseline labs pending  #Hypothyroidism: cont Synthroid 400 mcg  MatClarnce Flock30/2021, 8:32 AM

## 2019-07-12 NOTE — Progress Notes (Signed)
LABOR PROGRESS NOTE  Susan Huff is a 39 y.o. H6P5916 at [redacted]w[redacted]d  admitted for IOL for IUGR 2%..  Subjective: Comfortable w epidural  Objective: BP 135/80   Pulse (!) 58   Temp (!) 97.4 F (36.3 C) (Oral)   Resp 16   Ht 5\' 2"  (1.575 m)   Wt 83 kg   LMP 10/09/2018   BMI 33.47 kg/m  or  Vitals:   07/12/19 1751 07/12/19 1757 07/12/19 1801 07/12/19 1832  BP: (!) 129/95 136/84 129/89 135/80  Pulse: 63 61 60 (!) 58  Resp: 18 18 18 16   Temp:   (!) 97.4 F (36.3 C)   TempSrc:   Oral   Weight:      Height:         Dilation: 3 Effacement (%): 60 Cervical Position: Posterior Station: -3 Presentation: Vertex Exam by:: LIma, rn FHT: baseline rate 140, moderate varibility, +acel, -decel Toco: q3 min  Labs: Lab Results  Component Value Date   WBC 7.9 07/12/2019   HGB 12.4 07/12/2019   HCT 36.9 07/12/2019   MCV 90.7 07/12/2019   PLT 257 07/12/2019    Patient Active Problem List   Diagnosis Date Noted  . Encounter for induction of labor 07/12/2019  . Intrauterine growth restriction (IUGR) affecting care of mother, third trimester, single gestation 07/08/2019  . Alpha thalassemia silent carrier 04/17/2019  . LGSIL on Pap smear of cervix 03/05/2019  . AMA (advanced maternal age) multigravida 35+ 02/20/2019  . History of pre-eclampsia in prior pregnancy, currently pregnant 02/20/2019  . Obesity in pregnancy 02/20/2019  . Obesity, Class II, BMI 35-39.9 02/20/2019  . Hypertension in pregnancy, pre-existing, antepartum 01/23/2019  . History of preterm delivery, currently pregnant 01/23/2019  . Thyroid disease   . Supervision of high risk pregnancy, antepartum 12/18/2018    Assessment / Plan: 39 y.o. 02/17/2019 at [redacted]w[redacted]d here for IOL IUGR 2%.  Labor: started on pitocin earlier this morning. Around 1245 had deep prolonged variables of unclear duration due to discontinuous tracing and pitocin was stopped for 1 hour. Restarted 1338 with good tracing since, patient more  uncomfortable and now w epidural. Discussed w Dr. [id number], will keep intact as long as possible to avoid further variables, consider AROM around 12hr mark if not yet making more significant progress.   Fetal Wellbeing:  Cat I Pain Control:  epidural GBS: neg Anticipated MOD:  SVD  MOC: BTL, papers signed 05/10/2019  cHTN: Cont Metoprolol 100 mg, baseline labs pending  Hypothyroidism: cont Synthroid 400 mcg  Emelda Fear, MD/MPH OB Fellow  07/12/2019, 6:43 PM

## 2019-07-12 NOTE — Transfer of Care (Signed)
Immediate Anesthesia Transfer of Care Note  Patient: Susan Huff  Procedure(s) Performed: CESAREAN SECTION (N/A ) BILATERAL TUBAL LIGATION (Bilateral )  Patient Location: PACU  Anesthesia Type:Epidural  Level of Consciousness: awake, alert , oriented and patient cooperative  Airway & Oxygen Therapy: Patient Spontanous Breathing  Post-op Assessment: Report given to RN and Post -op Vital signs reviewed and stable  Post vital signs: Reviewed and stable  Last Vitals:  Vitals Value Taken Time  BP 122/78 07/12/19 2331  Temp 36.4 C 07/12/19 2330  Pulse 58 07/12/19 2338  Resp 17 07/12/19 2338  SpO2 98 % 07/12/19 2338  Vitals shown include unvalidated device data.  Last Pain:  Vitals:   07/12/19 2144  TempSrc: Oral  PainSc:          Complications: No apparent anesthesia complications

## 2019-07-12 NOTE — Anesthesia Preprocedure Evaluation (Signed)
Anesthesia Evaluation  Patient identified by MRN, date of birth, ID band Patient awake    Reviewed: Allergy & Precautions, H&P , NPO status , Patient's Chart, lab work & pertinent test results, reviewed documented beta blocker date and time   Airway Mallampati: II  TM Distance: >3 FB Neck ROM: full    Dental no notable dental hx. (+) Teeth Intact   Pulmonary former smoker,    Pulmonary exam normal breath sounds clear to auscultation       Cardiovascular hypertension, Pt. on home beta blockers negative cardio ROS Normal cardiovascular exam Rhythm:regular Rate:Normal     Neuro/Psych negative neurological ROS  negative psych ROS   GI/Hepatic negative GI ROS, Neg liver ROS,   Endo/Other  Hypothyroidism   Renal/GU negative Renal ROS  negative genitourinary   Musculoskeletal negative musculoskeletal ROS (+)   Abdominal (+) + obese,   Peds  Hematology negative hematology ROS (+)   Anesthesia Other Findings   Reproductive/Obstetrics (+) Pregnancy                             Anesthesia Physical Anesthesia Plan  ASA: II  Anesthesia Plan: Epidural   Post-op Pain Management:    Induction:   PONV Risk Score and Plan:   Airway Management Planned:   Additional Equipment:   Intra-op Plan:   Post-operative Plan:   Informed Consent: I have reviewed the patients History and Physical, chart, labs and discussed the procedure including the risks, benefits and alternatives for the proposed anesthesia with the patient or authorized representative who has indicated his/her understanding and acceptance.       Plan Discussed with:   Anesthesia Plan Comments:         Anesthesia Quick Evaluation

## 2019-07-12 NOTE — Op Note (Signed)
07/12/2019  11:21 PM  PATIENT:  Susan Huff  39 y.o. female  PRE-OPERATIVE DIAGNOSIS:  primary c/s with bilateral tubal ligation pregnancy 37 weeks 2 days, fetal intolerance of labor  POST-OPERATIVE DIAGNOSIS:  primary c/s with bilateral tubal ligation, delivered, pregnancy 37 weeks 2 days, fetal intolerance of labor  PROCEDURE:  Procedure(s): CESAREAN SECTION (N/A) BILATERAL TUBAL LIGATION (Bilateral)  SURGEON:  Surgeon(s) and Role:    * Jonnie Kind, MD - Primary    * Sparacino, Dan Europe, DO - Fellow  PHYSICIAN ASSISTANT:   ASSISTANTSMel Almond, MS 3  ANESTHESIA:   epidural, intraperitoneal Nesacaine  EBL: 350 mL  BLOOD ADMINISTERED:none  DRAINS: Urinary Catheter (Foley)   LOCAL MEDICATIONS USED:  Amount: Nesacaine 30 mL ml and OTHER   SPECIMEN:  Source of Specimen:  Placenta to pathology  DISPOSITION OF SPECIMEN:  PATHOLOGY  COUNTS:  YES  TOURNIQUET:  * No tourniquets in log *  DICTATION: .Dragon Dictation  PLAN OF CARE: Has admission orders  PATIENT DISPOSITION:  PACU - hemodynamically stable.   Delay start of Pharmacological VTE agent (>24hrs) due to surgical blood loss or risk of bleeding: not applicable Findings: Clear amniotic fluid of normal volume, cephalic presentation healthy appearing small female infant Apgars 9 9, normal-appearing placenta and cord, normal uterus  Details of procedure: Patient was taken the operating room, epidural anesthesia topped up, abdomen prepped and draped.  Timeout was conducted antibiotics administered, and procedure confirmed by surgical team.  Patient had some discomfort with Allis test so the epidural was topped up.  Transverse abdominal incision was made from skin to the fascia, which was opened sharply transversely and sharply dissected off of the underlying rectus muscles.  Peritoneal cavity was opened in the midline and then extended and then Nesacaine 30 mL introduced into the peritoneal cavity due to patient  discomfort and anxiety.  Transverse uterine incision was made without developing bladder flap, well above the bladder.  Amniotic fluid was clear.  The extension of the incision was blunt and fetal vertex rotated into the incision.  Bulb suctioning was performed and the cord cut at 1 minute.  Placenta delivered easily in response to uterine massage and IV oxytocin with good tone eventually achieved Uterus was swabbed out membranes appeared completely removed, and the uterus was closed in a running locking first layer of 0 Monocryl followed by continuous running second layer of 0 Monocryl.  Good hemostasis was achieved. Tubal ligation: Fallopian tubes were identified.  There was extensive varicosities in the broad ligament so decision was made to use Filshie clips.  Midsegment portion of each fallopian tube could be elevated tube traced to its fimbriated end, a few thin adhesions noted to the ovaries bilaterally, these were sharply dissected to allow for improved mobility of the tube, then the Filshie clip placed and secured and confirmed as being well positioned.  This was performed bilaterally. Alexis wound retractor was removed, the anterior peritoneum closed using running 2-0 Vicryl, the fascia closed using continuous running 0 Vicryl..  At this point Dr. Darene Lamer was called for delivery.  Subcutaneous tissues were made hemostatic with point cautery as necessary then reapproximated with interrupted 2-0 plain sutures x5 followed by subcuticular 4-0 Vicryl closure of the skin.  There was irrigation of swabbing out with moistened gauze prior to the reapproximation of the subcu fat.  Subcuticular 4-0 Vicryl was used to close the skin as mentioned above Steri-Strips and benzoin were applied for tissue skin edge approximation and honeycomb dressing will  be applied.  Sponge and needle counts were correct and confirmed with the electronic count.

## 2019-07-12 NOTE — Discharge Instructions (Signed)

## 2019-07-13 ENCOUNTER — Encounter (HOSPITAL_COMMUNITY): Payer: Self-pay | Admitting: Obstetrics and Gynecology

## 2019-07-13 MED ORDER — OXYCODONE HCL 5 MG PO TABS
5.0000 mg | ORAL_TABLET | ORAL | Status: DC | PRN
  Administered 2019-07-14 – 2019-07-15 (×4): 10 mg via ORAL
  Filled 2019-07-13 (×4): qty 2

## 2019-07-13 MED ORDER — ZOLPIDEM TARTRATE 5 MG PO TABS: 5.0000 mg | ORAL_TABLET | Freq: Every evening | ORAL | Status: DC | PRN

## 2019-07-13 MED ORDER — WITCH HAZEL-GLYCERIN EX PADS: 1.0000 "application " | MEDICATED_PAD | CUTANEOUS | Status: DC | PRN

## 2019-07-13 MED ORDER — OXYTOCIN 40 UNITS IN NORMAL SALINE INFUSION - SIMPLE MED: 2.5000 [IU]/h | INTRAVENOUS | Status: AC

## 2019-07-13 MED ORDER — HYDROMORPHONE HCL 1 MG/ML IJ SOLN: 0.2000 mg | INTRAMUSCULAR | Status: DC | PRN

## 2019-07-13 MED ORDER — SIMETHICONE 80 MG PO CHEW
80.0000 mg | CHEWABLE_TABLET | Freq: Three times a day (TID) | ORAL | Status: DC
  Administered 2019-07-13 – 2019-07-15 (×7): 80 mg via ORAL
  Filled 2019-07-13 (×7): qty 1

## 2019-07-13 MED ORDER — COCONUT OIL OIL: 1.0000 "application " | TOPICAL_OIL | Status: DC | PRN

## 2019-07-13 MED ORDER — KETOROLAC TROMETHAMINE 30 MG/ML IJ SOLN
30.0000 mg | Freq: Four times a day (QID) | INTRAMUSCULAR | Status: AC
  Administered 2019-07-13 (×4): 30 mg via INTRAVENOUS
  Filled 2019-07-13 (×4): qty 1

## 2019-07-13 MED ORDER — ACETAMINOPHEN 500 MG PO TABS
1000.0000 mg | ORAL_TABLET | Freq: Four times a day (QID) | ORAL | Status: DC
  Administered 2019-07-13 – 2019-07-15 (×9): 1000 mg via ORAL
  Filled 2019-07-13 (×10): qty 2

## 2019-07-13 MED ORDER — DIBUCAINE (PERIANAL) 1 % EX OINT: 1.0000 "application " | TOPICAL_OINTMENT | CUTANEOUS | Status: DC | PRN

## 2019-07-13 MED ORDER — TETANUS-DIPHTH-ACELL PERTUSSIS 5-2.5-18.5 LF-MCG/0.5 IM SUSP: 0.5000 mL | Freq: Once | INTRAMUSCULAR | Status: DC

## 2019-07-13 MED ORDER — ENOXAPARIN SODIUM 40 MG/0.4ML ~~LOC~~ SOLN
40.0000 mg | SUBCUTANEOUS | Status: DC
  Administered 2019-07-13 – 2019-07-15 (×2): 40 mg via SUBCUTANEOUS
  Filled 2019-07-13 (×2): qty 0.4

## 2019-07-13 MED ORDER — SIMETHICONE 80 MG PO CHEW
80.0000 mg | CHEWABLE_TABLET | ORAL | Status: DC
  Administered 2019-07-13 – 2019-07-15 (×3): 80 mg via ORAL
  Filled 2019-07-13 (×3): qty 1

## 2019-07-13 MED ORDER — IBUPROFEN 800 MG PO TABS
800.0000 mg | ORAL_TABLET | Freq: Four times a day (QID) | ORAL | Status: DC
  Administered 2019-07-13 – 2019-07-15 (×6): 800 mg via ORAL
  Filled 2019-07-13 (×6): qty 1

## 2019-07-13 MED ORDER — SENNOSIDES-DOCUSATE SODIUM 8.6-50 MG PO TABS
2.0000 | ORAL_TABLET | ORAL | Status: DC
  Administered 2019-07-13: 2 via ORAL
  Filled 2019-07-13: qty 2

## 2019-07-13 MED ORDER — SIMETHICONE 80 MG PO CHEW: 80.0000 mg | CHEWABLE_TABLET | ORAL | Status: DC | PRN

## 2019-07-13 MED ORDER — DIPHENHYDRAMINE HCL 25 MG PO CAPS
25.0000 mg | ORAL_CAPSULE | Freq: Four times a day (QID) | ORAL | Status: DC | PRN
  Administered 2019-07-13 – 2019-07-14 (×3): 25 mg via ORAL
  Filled 2019-07-13 (×3): qty 1

## 2019-07-13 MED ORDER — LACTATED RINGERS IV SOLN: INTRAVENOUS | Status: DC

## 2019-07-13 MED ORDER — MENTHOL 3 MG MT LOZG: 1.0000 | LOZENGE | OROMUCOSAL | Status: DC | PRN

## 2019-07-13 MED ORDER — PRENATAL MULTIVITAMIN CH
1.0000 | ORAL_TABLET | Freq: Every day | ORAL | Status: DC
  Administered 2019-07-13 – 2019-07-14 (×2): 1 via ORAL
  Filled 2019-07-13 (×2): qty 1

## 2019-07-13 NOTE — Plan of Care (Signed)

## 2019-07-13 NOTE — Discharge Summary (Addendum)
Postpartum Discharge Summary     Patient Name: Susan Huff DOB: 1980/08/15 MRN: [id number]  Date of admission: 07/12/2019 Delivering Provider: Merilyn Baba   Date of discharge: 07/15/2019  Admitting diagnosis: Encounter for induction of labor [Z34.90] Intrauterine pregnancy: [redacted]w[redacted]d    Secondary diagnosis:  Active Problems:   Supervision of high risk pregnancy, antepartum   Thyroid disease   Hypertension in pregnancy, pre-existing, antepartum   History of preterm delivery, currently pregnant   AMA (advanced maternal age) multigravida 35+   History of pre-eclampsia in prior pregnancy, currently pregnant   Obesity in pregnancy   LGSIL on Pap smear of cervix   Intrauterine growth restriction (IUGR) affecting care of mother, third trimester, single gestation   Encounter for induction of labor   Unwanted fertility  Additional problems: None     Discharge diagnosis: Term Pregnancy Delivered and CHTN                                                                                                Post partum procedures: Filshie clip BTL at time of CS  Augmentation: Pitocin  Complications: None  Hospital course:  Induction of Labor With Cesarean Section  39y.o. yo [id number] admitted to the hospital 07/12/2019 for induction of labor. Patient had a labor course significant for initiation of induction with Pitocin, which was stopped multiple times for prolonged fetal decelerations into the 90s.  The patient went for cesarean section due to Fetal Intolerance of Labor, and delivered a Viable infant,07/12/2019  Membrane Rupture Time/Date: 10:20 PM ,07/12/2019    Details of operation can be found in separate operative Note.    Patient had a postpartum course notable for the following:   #cHTN: on Metoprolol XL 100 mg prior to admission and for many years prior, changed to 50 mg due to asymptomatic but significant bradycardia and started on Enalapril '5mg'$  daily for mild range  BP's.   #Hypothyroidism: discharged on dose of 40053mdaily, aware she needs to follow up with Endocrinology for dosage adjustment now that she is post partum.   #BTL w Filshie clips: discussed <1% failure rate, increased risk of ectopic pregnancy, and importance of taking pregnancy test if she has any suspicion she may be pregnant.   #LSIL pap: stressed importance of scheduling colpo with Gyn clinic, possibility of progression to cervical cancer with untreated early dysplasia  At time of discharge she is ambulating, tolerating a regular diet, passing flatus, and urinating well.  Patient is discharged home in stable condition on 07/15/19.                                   Delivery time: 10:44 PM    Magnesium Sulfate received: No BMZ received: No Rhophylac:N/A MMR:N/A Transfusion:No  Physical exam  Vitals:   07/15/19 0524 07/15/19 0652 07/15/19 0900 07/15/19 1011  BP: (!) 157/88 (!) 131/91 (!) 140/95 (!) 140/95  Pulse: 60  63   Resp: 18     Temp: 97.9 F (36.6 C)  TempSrc: Axillary     SpO2: 100%     Weight:      Height:       General: alert and no distress Lochia: appropriate Uterine Fundus: firm Incision: Healing well with no significant drainage DVT Evaluation: No evidence of DVT seen on physical exam. Labs: Lab Results  Component Value Date   WBC 7.9 07/12/2019   HGB 12.4 07/12/2019   HCT 36.9 07/12/2019   MCV 90.7 07/12/2019   PLT 257 07/12/2019   CMP Latest Ref Rng & Units 07/12/2019  Glucose 70 - 99 mg/dL 74  BUN 6 - 20 mg/dL 5(L)  Creatinine 0.44 - 1.00 mg/dL 0.53  Sodium 135 - 145 mmol/L 135  Potassium 3.5 - 5.1 mmol/L 3.6  Chloride 98 - 111 mmol/L 105  CO2 22 - 32 mmol/L 18(L)  Calcium 8.9 - 10.3 mg/dL 8.3(L)  Total Protein 6.5 - 8.1 g/dL 6.1(L)  Total Bilirubin 0.3 - 1.2 mg/dL 0.5  Alkaline Phos 38 - 126 U/L 206(H)  AST 15 - 41 U/L 16  ALT 0 - 44 U/L 12   Edinburgh Score: Edinburgh Postnatal Depression Scale Screening Tool 07/14/2019  I have  been able to laugh and see the funny side of things. 0  I have looked forward with enjoyment to things. 0  I have blamed myself unnecessarily when things went wrong. 0  I have been anxious or worried for no good reason. 0  I have felt scared or panicky for no good reason. 0  Things have been getting on top of me. 0  I have been so unhappy that I have had difficulty sleeping. 0  I have felt sad or miserable. 0  I have been so unhappy that I have been crying. 0  The thought of harming myself has occurred to me. 0  Edinburgh Postnatal Depression Scale Total 0    Discharge instruction: per After Visit Summary and "Baby and Me Booklet".  After visit meds:  Allergies as of 07/15/2019      Reactions   Sulfa Antibiotics Rash      Medication List    TAKE these medications   Blood Pressure Kit Devi 1 Device by Does not apply route as needed.   enalapril 5 MG tablet Commonly known as: VASOTEC Take 1 tablet (5 mg total) by mouth daily.   levothyroxine 175 MCG tablet Commonly known as: SYNTHROID Take 2 tablets (350 mcg total) by mouth daily before breakfast.   levothyroxine 50 MCG tablet Commonly known as: Synthroid Take 1 tablet (50 mcg total) by mouth daily before breakfast. In addition to 358mg for 4020m total   metoprolol succinate 100 MG 24 hr tablet Commonly known as: TOPROL-XL Take 0.5 tablets (50 mg total) by mouth daily. Take with or immediately following a meal. What changed: how much to take   metoprolol succinate 50 MG 24 hr tablet Commonly known as: TOPROL-XL Take 1 tablet (50 mg total) by mouth daily. What changed:   medication strength  how much to take   oxyCODONE 5 MG immediate release tablet Commonly known as: Oxy IR/ROXICODONE Take 1-2 tablets (5-10 mg total) by mouth every 4 (four) hours as needed for moderate pain.   PRENATAL VITAMIN PO Take 1 tablet by mouth daily.   triamcinolone 55 MCG/ACT Aero nasal inhaler Commonly known as: NASACORT Place 1  spray into the nose 2 (two) times daily.       Diet: routine diet  Activity: Advance as tolerated. Pelvic rest for 6  weeks.   Outpatient follow up:6 weeks Follow up Appt:No future appointments. Follow up Visit: Please schedule this patient for Postpartum visit in: 6 weeks with the following provider: Any provider In person For C/S patients schedule nurse incision check in weeks 2 weeks: yes High risk pregnancy complicated by: FGR 2%, cHTN on meds, hypothyroidism on synthroid Delivery mode:  CS Anticipated Birth Control:  Filshie BTL during CS PP Procedures needed: Incision check, BP check, colposcopy Schedule Integrated BH visit: no  Newborn Data: Live born female  Birth Weight: 4 lb 12 oz (2155 g) APGAR: 8, 9  Newborn Delivery   Birth date/time: 07/12/2019 22:44:00 Delivery type: C-Section, Low Transverse Trial of labor: Yes C-section categorization: Primary      Baby Feeding: Breast Disposition:home with mother   07/15/2019 Clarnce Flock, MD

## 2019-07-13 NOTE — Progress Notes (Addendum)
Patient ID: Susan Huff, female   DOB: 1980/12/25, 39 y.o.   MRN: [id number] Post Operative Day 1 Subjective: no complaints, up ad lib, tolerating PO and + flatus. Foley catheter still in place, has not voided but is making good urine output.  Objective: Blood pressure 122/78, pulse (!) 51, temperature 98.6 F (37 C), temperature source Oral, resp. rate 18, height 5\' 2"  (1.575 m), weight 83 kg, last menstrual period 10/09/2018, SpO2 100 %, unknown if currently breastfeeding.  Physical Exam:  General: alert, cooperative and no distress Lochia: appropriate Uterine Fundus: firm Incision: dressing dry, clean and intact DVT Evaluation: No evidence of DVT seen on physical exam.  Recent Labs    07/12/19 0832 07/12/19 1653  HGB 13.9 12.4  HCT 41.0 36.9    Assessment/Plan: Continue routine postpartum care.  Probable discharge tomorrow or on POD#3, pending clinical improvement   LOS: 1 day   Kadar Chance L Abimbola Aki 07/13/2019, 8:39 AM   GME ATTESTATION:  I saw and evaluated the patient. I agree with the findings and the plan of care as documented in the student's note.  09/12/2019, DO OB Fellow, Faculty [facility], Center for [facility] 07/13/2019 8:39 AM

## 2019-07-13 NOTE — Anesthesia Postprocedure Evaluation (Signed)
Anesthesia Post Note  Patient: Susan Huff  Procedure(s) Performed: CESAREAN SECTION (N/A ) BILATERAL TUBAL LIGATION (Bilateral )     Patient location during evaluation: Mother Baby Anesthesia Type: Epidural Level of consciousness: oriented and awake and alert Pain management: pain level controlled Vital Signs Assessment: post-procedure vital signs reviewed and stable Respiratory status: spontaneous breathing and respiratory function stable Cardiovascular status: blood pressure returned to baseline and stable Postop Assessment: no headache, no backache, no apparent nausea or vomiting and able to ambulate Anesthetic complications: no    Last Vitals:  Vitals:   07/13/19 1532 07/13/19 1728  BP: 139/86 (!) 143/91  Pulse: (!) 47 (!) 46  Resp: 18 18  Temp: 36.6 C 36.8 C  SpO2: 100% 100%    Last Pain:  Vitals:   07/13/19 1728  TempSrc: Oral  PainSc: 0-No pain   Pain Goal:                   Trevor Iha

## 2019-07-13 NOTE — Lactation Note (Signed)
This note was copied from a baby's chart. Lactation Consultation Note  Patient Name: Susan Huff [id number] Date: 07/13/2019 Reason for consult: Initial assessment;Early term 37-38.6wks;Infant weight loss  Baby is 15 hours at the start of the [facility] consult.  [name] reported to consultant blood sugar was 38 .  LC checked the time it was 1203. Baby fed @ 1223 - 9 ml and at 1249 - 10 ml.  At 1435 working feeding the baby with a Dr. Ocie Bob and baby very sluggish  Few sucks. LC switched to finger feeding formula with curved tip and baby took 5 ml  With stimulation. And then tried the Nufant ( purple) with 5 ml more. Baby noted to be picking  Up with being rhythmic intermittently with stimulation,  [name] [facility] ) placed and order for a serum Bili at 4 pm.  Per mom has pumped x 2 with the DEBP that was set up by the [facility] and is aware to pump after every feeding  For 15 - 20 mins.  LC reviewed supply and demand.importance of pumping to enhance the milk coming in.  LC discussed the potential feeding behaviors of a baby less than 5 pound baby and 37 2/7 week infant. LC Stressed the importance at this point to just get her feeding from a bottle to give her quick calories she doesn't have to work hard for to increase the blood sugar  To a stable level.  LC provided information for a LPT infant ( due to her baby being and ET , less then 5 pounds)  This baby has potential to act like a LPT infant . And the [facility] pamphlet provided.    Maternal Data Has patient been taught Hand Expression?: Yes  Feeding Feeding Type: Formula(did not  latch due to blood sugar being 38/ baby sluggish ) Nipple Type: Nfant Slow Flow (purple)(switched from Dr. Manson Passey )  [facility] Score                   Interventions Interventions: Breast feeding basics reviewed  Lactation Tools Discussed/Used Tools: Pump Breast pump type: Double-Electric Breast Pump(already set up/per mom has pumped x2/enc after  every feed )   Consult Status Consult Status: Follow-up Date: 07/13/19 Follow-up type: In-patient    [name] Adriella Essex 07/13/2019, 3:02 PM

## 2019-07-14 MED ORDER — METOPROLOL SUCCINATE ER 50 MG PO TB24
50.0000 mg | ORAL_TABLET | Freq: Every day | ORAL | Status: DC
  Administered 2019-07-15: 10:00:00 50 mg via ORAL
  Filled 2019-07-14: qty 1

## 2019-07-14 NOTE — Progress Notes (Signed)
Patient ID: Susan Huff, female   DOB: January 06, 1981, 39 y.o.   MRN: [id number] Post Operative Day 2 Subjective: no complaints, up ad lib, tolerating PO and + flatus. Has been voiding, already had BM. Has been up and moving around. Pain has been a little worse today but taking meds and better controlled. Lochia appropriate.  Objective: Blood pressure 138/84, pulse (!) 56, temperature 98 F (36.7 C), temperature source Oral, resp. rate 17, height 5\' 2"  (1.575 m), weight 83 kg, last menstrual period 10/09/2018, SpO2 100 %, unknown if currently breastfeeding.  Physical Exam:  General: alert, cooperative and no distress Lochia: appropriate Uterine Fundus: firm Incision: dressing dry, clean and intact, few spots of dried blood  DVT Evaluation: No evidence of DVT seen on physical exam.  Recent Labs    07/12/19 0832 07/12/19 1653  HGB 13.9 12.4  HCT 41.0 36.9    Assessment/Plan: Continue routine postpartum care.  Probable discharge today or on POD#3, pending when baby can discharge. Okay to discharge today if baby can; RN to page team for orders. Breast and bottle feeding S/p BTL with Filshie  Patient states she is on Metoprolol 100 mg daily for HTN, she has been on this medication for 13 years. Will decrease to 50 mg daily due to HR. BP check requested in 1-2 weeks. Consider alternative if patient truly needs for BP. Cont Synthroid  Vitals stable   LOS: 2 days   07/14/19, MD [facility] Fellow, [facility] for [facility], [facility] 07/14/2019, 1:29 PM

## 2019-07-15 LAB — CULTURE, BETA STREP (GROUP B ONLY)

## 2019-07-15 MED ORDER — ENALAPRIL MALEATE 5 MG PO TABS
5.0000 mg | ORAL_TABLET | Freq: Every day | ORAL | Status: DC
  Administered 2019-07-15: 10:00:00 5 mg via ORAL
  Filled 2019-07-15: qty 1

## 2019-07-15 MED ORDER — ENALAPRIL MALEATE 5 MG PO TABS
5.0000 mg | ORAL_TABLET | Freq: Every day | ORAL | 0 refills | Status: AC
Start: 2019-07-15 — End: 2019-08-26

## 2019-07-15 MED ORDER — METOPROLOL SUCCINATE ER 50 MG PO TB24: 50.0000 mg | ORAL_TABLET | Freq: Every day | ORAL | 5 refills | Status: AC

## 2019-07-15 MED ORDER — OXYCODONE HCL 5 MG PO TABS: 5.0000 mg | ORAL_TABLET | ORAL | 0 refills | Status: AC | PRN

## 2019-07-15 MED ORDER — METOPROLOL SUCCINATE ER 100 MG PO TB24: 50.0000 mg | ORAL_TABLET | Freq: Every day | ORAL | 1 refills | Status: AC

## 2019-07-15 MED FILL — oxyCODONE HCL 5 MG TABS: 5 | 2 days supply | Qty: 20 | Fill #0

## 2019-07-15 MED FILL — ENALAPRIL MALEATE 5 MG TABS: 5 | 30 days supply | Qty: 30 | Fill #0

## 2019-07-15 NOTE — Lactation Note (Addendum)
This note was copied from a baby's chart. Lactation Consultation Note  Patient Name: Girl Jaslene Marsteller [id number] Date: 07/15/2019   Baby 57 hours old.  [redacted]w[redacted]d.  < 5 lbs.  Mother is breastfeeding, supplementing with formula and pumping. Mother knows to limit feedings to 30 min which includes supplementation. LC will check back to room to view the next breastfeeding session. Discussed waking for feeds if needed and post pumping at least every other feeding. Give volume back to baby at the next feeding with the difference w/ formula. Discussed volume guidelines, increasing per day of life and as baby desires. Mother is using Nfant purple nipple for feeding supplement. Mother feels baby does well at the breast but knows to supplement. Reviewed engorgement care and monitoring voids/stools. Mother has DEBP at home.   Returned to room to observe latch.  Baby opened wide and latched. Recommend mother latch in cross cradle hold provided head support instead of cradle. Baby has recently bf for 15 min so this feeding was short and mother gave formula bottle after.  Demonstrated how to perform tug on bottle to interest baby in taking more volume. Mother seems confident with feedings.       Maternal Data    Feeding Feeding Type: Bottle Fed - Formula Nipple Type: Extra Slow Flow  LATCH Score                   Interventions    Lactation Tools Discussed/Used     Consult Status      Hardie Pulley 07/15/2019, 8:35 AM

## 2019-07-16 LAB — SURGICAL PATHOLOGY

## 2019-07-31 ENCOUNTER — Inpatient Hospital Stay (HOSPITAL_COMMUNITY): Admission: AD | Admit: 2019-07-31 | Payer: 59 | Source: Home / Self Care

## 2019-08-01 ENCOUNTER — Ambulatory Visit: Payer: Self-pay

## 2019-08-20 ENCOUNTER — Ambulatory Visit: Payer: Self-pay | Admitting: Student

## 2019-08-26 ENCOUNTER — Other Ambulatory Visit: Payer: Self-pay

## 2019-08-26 ENCOUNTER — Encounter: Payer: Self-pay | Admitting: Obstetrics and Gynecology

## 2019-08-26 ENCOUNTER — Ambulatory Visit (INDEPENDENT_AMBULATORY_CARE_PROVIDER_SITE_OTHER): Payer: Self-pay | Admitting: Obstetrics & Gynecology

## 2019-08-26 ENCOUNTER — Encounter: Payer: Self-pay | Admitting: Obstetrics & Gynecology

## 2019-08-26 VITALS — BP 120/75 | HR 69 | Wt 180.7 lb

## 2019-08-26 DIAGNOSIS — R87612 Low grade squamous intraepithelial lesion on cytologic smear of cervix (LGSIL): Secondary | ICD-10-CM

## 2019-08-26 NOTE — Patient Instructions (Signed)
Colposcopy Colposcopy is a procedure to examine the lowest part of the uterus (cervix), the vagina, and the area around the vaginal opening (vulva) for abnormalities or signs of disease. The procedure is done using a lighted microscope or magnifying lens (colposcope). If any unusual cells are found during the procedure, your health care provider may remove a tissue sample for testing (biopsy). A colposcopy may be done if you:  Have an abnormal Pap test. A Pap test is a screening test that is used to check for signs of cancer or infection of the vagina, cervix, and uterus.  Have a Pap smear test in which you test positive for high-risk HPV (human papillomavirus).  Have a sore or lesion on your cervix.  Have genital warts on your vulva, vagina, or cervix.  Took certain medicines while pregnant, such as diethylstilbestrol (DES).  Have pain during sexual intercourse.  Have vaginal bleeding, especially after sexual intercourse.  Need to have a cervical polyp removed.  Need to have a lost intrauterine device (IUD) string located. Let your health care provider know about:  Any allergies you have, including allergies to prescribed medicine, latex, or iodine.  All medicines you are taking, including vitamins, herbs, eye drops, creams, and over-the-counter medicines. Bring a list of all of your medicines to your appointment.  Any problems you or family members have had with anesthetic medicines.  Any blood disorders you have.  Any surgeries you have had.  Any medical conditions you have, such as pelvic inflammatory disease (PID) or endometrial disorder.  Any history of frequent fainting.  Your menstrual cycle and what form of birth control (contraception) you use.  Your medical history, including any prior cervical treatment.  Whether you are pregnant or may be pregnant. What are the risks? Generally, this is a safe procedure. However, problems may occur,  including:  Pain.  Infection, which may include a fever, bad-smelling discharge, or pelvic pain.  Bleeding or discharge.  Misdiagnosis.  Fainting and vasovagal reactions, but this is rare.  Allergic reactions to medicines.  Damage to other structures or organs. What happens before the procedure?  If you have your menstrual period or will have it at the time of your procedure, tell your health care provider. A colposcopy typically is not done during menstruation.  Continue your contraceptive practices before and after the procedure.  For 24 hours before the colposcopy: ? Do not douche. ? Do not use tampons. ? Do not use medicines, creams, or suppositories in the vagina. ? Do not have sexual intercourse.  Ask your health care provider about: ? Changing or stopping your regular medicines. This is especially important if you are taking diabetes medicines or blood thinners. ? Taking medicines such as aspirin and ibuprofen. These medicines can thin your blood. Do not take these medicines before your procedure if your health care provider instructs you not to. It is likely that your health care provider will tell you to avoid taking aspirin or medicine that contains aspirin for 7 days before the procedure.  Follow instructions from your health care provider about eating or drinking restrictions. You will likely need to eat a regular diet the day of the procedure and not skip any meals.  You may have an exam or testing. A pregnancy test will be taken on the day of the procedure.  You may have a blood or urine sample taken.  Plan to have someone take you home from the hospital or clinic.  If you will be going   home right after the procedure, plan to have someone with you for 24 hours. What happens during the procedure?  You will lie down on your back, with your feet in foot rests (stirrups).  A warmed and lubricated instrument (speculum) will be inserted into your vagina. The  speculum will be used to hold apart the walls of your vagina so your health care provider can see your cervix and the inside of your vagina.  A cotton swab will be used to place a small amount of liquid solution on the areas to be examined. This solution makes it easier to see abnormal cells. You may feel a slight burning during this part.  The colposcope will be used to scan the cervix with a bright white light. The colposcope will be held near your vulvaand will magnify your vulva, vagina, and cervix for easier examination.  Your health care provider may decide to take a biopsy. If so: ? You may be given medicine to numb the area (local anesthetic). ? Surgical instruments will be used to suck out mucus and cells through your vagina. ? You may feel mild pain while the tissue sample is removed. ? Bleeding may occur. A solution may be used to stop the bleeding. ? If a sample of tissue is needed from the inside of the cervix, a different procedure called endocervical curettage (ECC) may be completed. During this procedure, a curved instrument (curette) will be used to scrape cells from your cervix or the top of your cervix (endocervix).  Your health care provider will record the location of any abnormalities. The procedure may vary among health care providers and hospitals. What happens after the procedure?  You will lie down and rest for a few minutes. You may be offered juice or cookies.  Your blood pressure, heart rate, breathing rate, and blood oxygen level will be monitored until any medicines you were given have worn off.  You may have to wear compression stockings. These stockings help to prevent blood clots and reduce swelling in your legs.  You may have some cramping in your abdomen. This should go away after a few minutes. This information is not intended to replace advice given to you by your health care provider. Make sure you discuss any questions you have with your health care  provider. Document Revised: 02/10/2017 Document Reviewed: 10/05/2015 Elsevier Patient Education  2020 Elsevier Inc.  

## 2019-08-26 NOTE — Progress Notes (Signed)
Subjective:     Susan Huff is a 40 y.o. female who presents for a postpartum visit. She is 8 weeks postpartum following a low cervical transverse Cesarean section. I have fully reviewed the prenatal and intrapartum course. The delivery was at 37.2 gestational weeks. Outcome: primary cesarean section, low transverse incision. Anesthesia: epidural. Postpartum course has been uncomplicated. Baby's course has been uncomplicated. Baby is feeding by both breast and bottle - Similac Neosure. Bleeding no bleeding. Bowel function is normal. Bladder function is normal. Patient is not sexually active. Contraception method is tubal ligation. Postpartum depression screening: negative.  The following portions of the patient's history were reviewed and updated as appropriate: allergies, current medications, past family history, past medical history, past social history, past surgical history and problem list.  Review of Systems Pertinent items are noted in HPI.   Objective:    BP 120/75   Pulse 69   Wt 180 lb 11.2 oz (82 kg)   LMP 10/09/2018   BMI 33.05 kg/m   General:  alert and cooperative   Breasts:  negative  Lungs: clear to auscultation bilaterally  Heart:  regular rate and rhythm, S1, S2 normal, no murmur, click, rub or gallop  Abdomen: soft, non-tender; bowel sounds normal; no masses,  no organomegaly  Incision:  C/D/I healing well.         Assessment:     Normal postpartum exam. Pap smear not done at today's visit.   Plan:    1. Contraception: tubal ligation 2. Will need follow colpo for abnormal pap 3. Follow up in: 2 months or as needed.

## 2019-09-30 ENCOUNTER — Ambulatory Visit: Payer: Self-pay | Admitting: Obstetrics and Gynecology

## 2019-09-30 ENCOUNTER — Encounter: Payer: Self-pay | Admitting: Obstetrics and Gynecology

## 2019-10-01 NOTE — Progress Notes (Signed)
Patient did not keep her colposcopy appointment for 09/30/2019. Front desk to call to reschedule and send letter PRN.  Cornelia Copa MD Attending Center for [employer] [facility])

## 2019-10-02 ENCOUNTER — Telehealth: Payer: Self-pay | Admitting: Obstetrics and Gynecology

## 2019-10-02 ENCOUNTER — Encounter: Payer: Self-pay | Admitting: Obstetrics and Gynecology

## 2019-10-02 NOTE — Telephone Encounter (Signed)
Attempted to contact patient to get her rescheduled for her missed appointment. No answer, left voicemail for patient to give the office a call back to be rescheduled. No show letter mailed

## 2019-12-24 ENCOUNTER — Other Ambulatory Visit: Payer: Self-pay | Admitting: Family Medicine

## 2020-01-20 ENCOUNTER — Encounter: Payer: Self-pay | Admitting: *Deleted

## 2020-02-03 ENCOUNTER — Other Ambulatory Visit: Payer: Self-pay

## 2020-02-03 ENCOUNTER — Ambulatory Visit: Admission: EM | Admit: 2020-02-03 | Discharge: 2020-02-03 | Discharge status: Against Medical Advice | Payer: 59

## 2020-02-03 ENCOUNTER — Ambulatory Visit: Payer: Self-pay

## 2020-02-21 ENCOUNTER — Encounter: Payer: Self-pay | Admitting: General Practice

## 2020-11-12 ENCOUNTER — Other Ambulatory Visit: Payer: Self-pay | Admitting: Family Medicine

## 2023-04-15 DEATH — deceased
